# Patient Record
Sex: Female | Born: 1975 | Race: White | Hispanic: No | Marital: Single | Smoking: Current every day smoker
Health system: Southern US, Community
[De-identification: ages and names within clinical notes are randomized; demographics above are authoritative.]

## PROBLEM LIST (undated history)

## (undated) DIAGNOSIS — N200 Calculus of kidney: Secondary | ICD-10-CM

## (undated) DIAGNOSIS — J40 Bronchitis, not specified as acute or chronic: Secondary | ICD-10-CM

## (undated) DIAGNOSIS — F419 Anxiety disorder, unspecified: Secondary | ICD-10-CM

## (undated) DIAGNOSIS — S3992XA Unspecified injury of lower back, initial encounter: Secondary | ICD-10-CM

## (undated) HISTORY — PX: ABDOMINAL HYSTERECTOMY: SHX81

---

## 2012-05-03 ENCOUNTER — Emergency Department (HOSPITAL_BASED_OUTPATIENT_CLINIC_OR_DEPARTMENT_OTHER): Payer: Self-pay

## 2012-05-03 ENCOUNTER — Emergency Department (HOSPITAL_BASED_OUTPATIENT_CLINIC_OR_DEPARTMENT_OTHER)
Admission: EM | Admit: 2012-05-03 | Discharge: 2012-05-03 | Disposition: A | Discharge status: Home / Self Care | Payer: Self-pay | Attending: Emergency Medicine | Admitting: Emergency Medicine

## 2012-05-03 ENCOUNTER — Encounter (HOSPITAL_BASED_OUTPATIENT_CLINIC_OR_DEPARTMENT_OTHER): Payer: Self-pay | Admitting: *Deleted

## 2012-05-03 DIAGNOSIS — Y9289 Other specified places as the place of occurrence of the external cause: Secondary | ICD-10-CM | POA: Insufficient documentation

## 2012-05-03 DIAGNOSIS — W1809XA Striking against other object with subsequent fall, initial encounter: Secondary | ICD-10-CM | POA: Insufficient documentation

## 2012-05-03 DIAGNOSIS — Y9389 Activity, other specified: Secondary | ICD-10-CM | POA: Insufficient documentation

## 2012-05-03 DIAGNOSIS — F17200 Nicotine dependence, unspecified, uncomplicated: Secondary | ICD-10-CM | POA: Insufficient documentation

## 2012-05-03 DIAGNOSIS — W19XXXA Unspecified fall, initial encounter: Secondary | ICD-10-CM

## 2012-05-03 DIAGNOSIS — S20219A Contusion of unspecified front wall of thorax, initial encounter: Secondary | ICD-10-CM | POA: Insufficient documentation

## 2012-05-03 DIAGNOSIS — IMO0002 Reserved for concepts with insufficient information to code with codable children: Secondary | ICD-10-CM | POA: Insufficient documentation

## 2012-05-03 DIAGNOSIS — IMO0001 Reserved for inherently not codable concepts without codable children: Secondary | ICD-10-CM | POA: Insufficient documentation

## 2012-05-03 DIAGNOSIS — M2550 Pain in unspecified joint: Secondary | ICD-10-CM | POA: Insufficient documentation

## 2012-05-03 DIAGNOSIS — F411 Generalized anxiety disorder: Secondary | ICD-10-CM | POA: Insufficient documentation

## 2012-05-03 DIAGNOSIS — Z79899 Other long term (current) drug therapy: Secondary | ICD-10-CM | POA: Insufficient documentation

## 2012-05-03 HISTORY — DX: Anxiety disorder, unspecified: F41.9

## 2012-05-03 HISTORY — DX: Unspecified injury of lower back, initial encounter: S39.92XA

## 2012-05-03 MED ORDER — IBUPROFEN 800 MG PO TABS
800.0000 mg | ORAL_TABLET | Freq: Once | ORAL | Status: AC
  Administered 2012-05-03: 800 mg via ORAL
  Filled 2012-05-03: qty 1

## 2012-05-03 MED ORDER — IBUPROFEN 800 MG PO TABS: 800.0000 mg | ORAL_TABLET | Freq: Three times a day (TID) | ORAL | Status: DC

## 2012-05-03 NOTE — ED Notes (Signed)
Patient fell today in the shower.  States that she fell and her back hit the tub.  C/O upper back pain that radiates to her left shoulder.  Patient denies LOC.  Patient also states that she has history of back fracture when she was a child.  Patient denies any radiation of the pain to her legs.

## 2012-05-03 NOTE — ED Provider Notes (Signed)
History     CSN: [id number]  Arrival date & time 05/03/12  1052   First MD Initiated Contact with Patient 05/03/12 1142      Chief Complaint  Patient presents with  . Fall    (Consider location/radiation/quality/duration/timing/severity/associated sxs/prior treatment) HPI Comments: Patient slipped and fell in the shower today and hit her left side of the tub. She complains of pain to her left ribs and low back. Denies hitting her head or losing consciousness. No headache, neck pain, chest pain or abdominal pain. Pain does not radiate. No bowel bladder incontinence. No focal weakness, numbness or tingling.  The history is provided by the patient.    Past Medical History  Diagnosis Date  . Anxiety   . Back injury     History reviewed. No pertinent past surgical history.  History reviewed. No pertinent family history.  History  Substance Use Topics  . Smoking status: Current Every Day Smoker -- 0.5 packs/day    Types: Cigarettes  . Smokeless tobacco: Not on file  . Alcohol Use: No    OB History    Grav Para Term Preterm Abortions TAB SAB Ect Mult Living                  Review of Systems  Constitutional: Negative for activity change and appetite change.  HENT: Negative for congestion and rhinorrhea.   Respiratory: Negative for cough, chest tightness and shortness of breath.   Cardiovascular: Negative for chest pain.  Gastrointestinal: Negative for nausea, vomiting and abdominal pain.  Genitourinary: Negative for dysuria, hematuria, vaginal bleeding and vaginal discharge.  Musculoskeletal: Positive for myalgias, back pain and arthralgias.  Skin: Negative for rash.  Neurological: Negative for dizziness, weakness and headaches.    Allergies  Review of patient's allergies indicates no known allergies.  Home Medications   Current Outpatient Rx  Name  Route  Sig  Dispense  Refill  . IBUPROFEN 800 MG PO TABS   Oral   Take 1 tablet (800 mg total) by mouth 3  (three) times daily.   21 tablet   0   . LORAZEPAM 1 MG PO TABS   Oral   Take 1 mg by mouth 2 (two) times daily.           BP 139/88  Pulse 73  Temp 97.6 F (36.4 C) (Oral)  Resp 20  Ht 5\' 3"  (1.6 m)  Wt 275 lb (124.739 kg)  BMI 48.71 kg/m2  SpO2 100%  Physical Exam  Constitutional: She is oriented to person, place, and time. She appears well-developed and well-nourished. No distress.  HENT:  Head: Normocephalic and atraumatic.  Mouth/Throat: Oropharynx is clear and moist. No oropharyngeal exudate.  Eyes: Conjunctivae normal and EOM are normal. Pupils are equal, round, and reactive to light.  Neck: Normal range of motion. Neck supple.       No C spine pain, tenderness  Cardiovascular: Normal rate, regular rhythm and normal heart sounds.   No murmur heard. Pulmonary/Chest: Effort normal and breath sounds normal. No respiratory distress.       TTP paraspinal thoracic and lateral ribs.  No ecchymosis or crepitance.  Abdominal: Soft. There is no tenderness. There is no rebound and no guarding.  Musculoskeletal: Normal range of motion. She exhibits no edema and no tenderness.       TTP in lumbar spine.  Neurological: She is alert and oriented to person, place, and time. No cranial nerve deficit. She exhibits normal muscle tone. Coordination normal.  5/5 strength in bilateral lower extremities. Ankle plantar and dorsiflexion intact. Great toe extension intact bilaterally. +2 DP and PT pulses. +2 patellar reflexes bilaterally. Normal gait.   Skin: Skin is warm.    ED Course  Procedures (including critical care time)  Labs Reviewed - No data to display Dg Ribs Unilateral W/chest Left  05/03/2012  *RADIOLOGY REPORT*  Clinical Data: Fall, back pain  LEFT RIBS AND CHEST - 3+ VIEW  Comparison: None.  Findings: Normal cardiac silhouette.  No pulmonary contusion, pleural fluid, or pneumothorax.  No evidence of fracture. Dedicated views of the left ribs demonstrate no fracture.   IMPRESSION: No radiographic evidence of thoracic injury.   Original Report Authenticated By: Genevive Bi, M.D.    Dg Lumbar Spine Complete  05/03/2012  *RADIOLOGY REPORT*  Clinical Data: Fall, back pain  LUMBAR SPINE - COMPLETE 4+ VIEW  Comparison: None  Findings: There is normal alignment of the lumbar vertebral bodies. No loss of vertebral body height or disc height.  No subluxation. Oblique projections demonstrate no pars fracture.  IMPRESSION: No acute findings of the  lumbar spine.   Original Report Authenticated By: Genevive Bi, M.D.      1. Rib contusion   2. Fall       MDM  Mechanical fall in the shower hitting her left side and low back. Did not hit head or lose consciousness. Complains of lumbar spine pain and left-sided rib pain. No shortness of breath.  Vital stable, no hypoxia. Abdomen soft and nontender. No ecchymosis or crepitance to chest wall.  X-rays negative for fracture. We'll treat for rib contusion with anti-inflammatories. Followup with PCP.       Glynn Octave, MD 05/03/12 [phone]

## 2013-02-27 ENCOUNTER — Encounter (HOSPITAL_BASED_OUTPATIENT_CLINIC_OR_DEPARTMENT_OTHER): Payer: Self-pay | Admitting: Emergency Medicine

## 2013-02-27 ENCOUNTER — Emergency Department (HOSPITAL_BASED_OUTPATIENT_CLINIC_OR_DEPARTMENT_OTHER)
Admission: EM | Admit: 2013-02-27 | Discharge: 2013-02-28 | Disposition: A | Discharge status: Home / Self Care | Payer: Self-pay | Attending: Emergency Medicine | Admitting: Emergency Medicine

## 2013-02-27 DIAGNOSIS — Z791 Long term (current) use of non-steroidal anti-inflammatories (NSAID): Secondary | ICD-10-CM | POA: Insufficient documentation

## 2013-02-27 DIAGNOSIS — R05 Cough: Secondary | ICD-10-CM | POA: Insufficient documentation

## 2013-02-27 DIAGNOSIS — Z87828 Personal history of other (healed) physical injury and trauma: Secondary | ICD-10-CM | POA: Insufficient documentation

## 2013-02-27 DIAGNOSIS — R059 Cough, unspecified: Secondary | ICD-10-CM | POA: Insufficient documentation

## 2013-02-27 DIAGNOSIS — F411 Generalized anxiety disorder: Secondary | ICD-10-CM | POA: Insufficient documentation

## 2013-02-27 DIAGNOSIS — Z79899 Other long term (current) drug therapy: Secondary | ICD-10-CM | POA: Insufficient documentation

## 2013-02-27 DIAGNOSIS — F17200 Nicotine dependence, unspecified, uncomplicated: Secondary | ICD-10-CM | POA: Insufficient documentation

## 2013-02-27 DIAGNOSIS — R1110 Vomiting, unspecified: Secondary | ICD-10-CM | POA: Insufficient documentation

## 2013-02-27 DIAGNOSIS — N200 Calculus of kidney: Secondary | ICD-10-CM | POA: Insufficient documentation

## 2013-02-27 HISTORY — DX: Calculus of kidney: N20.0

## 2013-02-27 MED ORDER — KETOROLAC TROMETHAMINE 30 MG/ML IJ SOLN
30.0000 mg | Freq: Once | INTRAMUSCULAR | Status: AC
  Administered 2013-02-28: 30 mg via INTRAVENOUS
  Filled 2013-02-27: qty 1

## 2013-02-27 MED ORDER — ONDANSETRON HCL 4 MG/2ML IJ SOLN
4.0000 mg | Freq: Once | INTRAMUSCULAR | Status: AC
  Administered 2013-02-28: 4 mg via INTRAVENOUS
  Filled 2013-02-27: qty 2

## 2013-02-27 MED ORDER — MORPHINE SULFATE 4 MG/ML IJ SOLN
4.0000 mg | Freq: Once | INTRAMUSCULAR | Status: AC
  Administered 2013-02-28: 4 mg via INTRAVENOUS
  Filled 2013-02-27: qty 1

## 2013-02-27 NOTE — ED Provider Notes (Signed)
CSN: [id number]     Arrival date & time 02/27/13  2325 History  This chart was scribed for Yishai Rehfeld Smitty Cords, MD by Ronal Fear, ED Scribe. This patient was seen in room MH01/MH01 and the patient's care was started at 11:44 PM.      Chief Complaint  Patient presents with  . Flank Pain   Patient is a 37 y.o. female presenting with flank pain and vomiting. The history is provided by the patient. No language interpreter was used.  Flank Pain This is a new problem. The current episode started 6 to 12 hours ago. The problem occurs rarely. The problem has been gradually worsening. Pertinent negatives include no chest pain and no abdominal pain. Nothing aggravates the symptoms. Nothing relieves the symptoms.  Emesis Severity:  Mild Duration:  6 hours Timing:  Rare Quality:  Stomach contents Able to tolerate:  Liquids Progression:  Unchanged Chronicity:  New Recent urination:  Normal Relieved by:  None tried Worsened by:  Nothing tried Ineffective treatments:  None tried Associated symptoms: cough   Associated symptoms: no abdominal pain     pt is complaining of flank pain and emesis. she was seen at [facility] earlier but was not treated. Pain is located in her left groin and radiates to her back. She has had similar pain with kidney stones, she states that the pain feels like she is in labor. She vomited at the hospital, the bags were full.  Pt does not have a urologist currently.  Pt had a hysterectomy.  Past Medical History  Diagnosis Date  . Anxiety   . Back injury    No past surgical history on file. No family history on file. History  Substance Use Topics  . Smoking status: Current Every Day Smoker -- 0.50 packs/day    Types: Cigarettes  . Smokeless tobacco: Not on file  . Alcohol Use: No   OB History   Grav Para Term Preterm Abortions TAB SAB Ect Mult Living                 Review of Systems  Cardiovascular: Negative for chest pain.  Gastrointestinal:  Positive for vomiting. Negative for abdominal pain.  Genitourinary: Positive for dysuria, hematuria and flank pain.  All other systems reviewed and are negative.    Allergies  Review of patient's allergies indicates no known allergies.  Home Medications   Current Outpatient Rx  Name  Route  Sig  Dispense  Refill  . ibuprofen (ADVIL,MOTRIN) 800 MG tablet   Oral   Take 1 tablet (800 mg total) by mouth 3 (three) times daily.   21 tablet   0   . LORazepam (ATIVAN) 1 MG tablet   Oral   Take 1 mg by mouth 2 (two) times daily.          There were no vitals taken for this visit. Physical Exam  Nursing note and vitals reviewed. Constitutional: She appears well-developed and well-nourished.  Awake, alert, nontoxic appearance with baseline speech for patient.  HENT:  Head: Normocephalic and atraumatic.  Mouth/Throat: Oropharynx is clear and moist. No oropharyngeal exudate.  Eyes: EOM are normal. Pupils are equal, round, and reactive to light. Right eye exhibits no discharge. Left eye exhibits no discharge.  Neck: Neck supple.  Cardiovascular: Normal rate, regular rhythm and normal heart sounds.   No murmur heard. Pulmonary/Chest: Effort normal and breath sounds normal. No stridor. No respiratory distress. She has no wheezes. She has no rales. She exhibits  no tenderness.  Abdominal: Soft. Bowel sounds are normal. She exhibits no mass. There is no tenderness. There is no rebound.  Musculoskeletal: She exhibits no tenderness.  Baseline ROM, moves extremities with no obvious new focal weakness.  Lymphadenopathy:    She has no cervical adenopathy.  Neurological: She displays normal reflexes. She exhibits normal muscle tone.  Awake, alert, cooperative and aware of situation; motor strength bilaterally; sensation normal to light touch bilaterally; peripheral visual fields full to confrontation; no facial asymmetry; tongue midline; major cranial nerves appear intact; no pronator drift,  normal finger to nose bilaterally, baseline gait without new ataxia.  Skin: Skin is warm and dry. No rash noted.  Psychiatric: She has a normal mood and affect.    ED Course  Procedures (including critical care time)  COORDINATION OF CARE:  11:50 PM- Pt advised of plan for treatment including pain medication and pt agrees.    Labs Review Labs Reviewed - No data to display Imaging Review No results found.  EKG Interpretation   None       MDM  Kidney stone, feels improved will need pain meds and close follow up with urology.  Return for worsening symptoms I personally performed the services described in this documentation, which was scribed in my presence. The recorded information has been reviewed and is accurate.     Jasmine Awe, MD 02/28/13 0201

## 2013-02-27 NOTE — ED Notes (Signed)
Pt reports flank pain left side with n/v since 1730. Went to [facility] and left due to wait- already eval by EDP Palumbo

## 2013-02-28 ENCOUNTER — Emergency Department (HOSPITAL_BASED_OUTPATIENT_CLINIC_OR_DEPARTMENT_OTHER): Payer: Self-pay

## 2013-02-28 LAB — URINALYSIS, ROUTINE W REFLEX MICROSCOPIC
Bilirubin Urine: NEGATIVE
Leukocytes, UA: NEGATIVE
Nitrite: NEGATIVE
Protein, ur: 300 mg/dL — AB
pH: 5.5 (ref 5.0–8.0)

## 2013-02-28 LAB — URINE MICROSCOPIC-ADD ON

## 2013-02-28 MED ORDER — SODIUM CHLORIDE 0.9 % IV SOLN
Freq: Once | INTRAVENOUS | Status: AC
  Administered 2013-02-28: via INTRAVENOUS

## 2013-02-28 MED ORDER — PROMETHAZINE HCL 12.5 MG RE SUPP: 12.5000 mg | Freq: Four times a day (QID) | RECTAL | Status: DC | PRN

## 2013-02-28 MED ORDER — OXYCODONE-ACETAMINOPHEN 5-325 MG PO TABS: 1.0000 | ORAL_TABLET | Freq: Four times a day (QID) | ORAL | Status: DC | PRN

## 2013-02-28 MED ORDER — OXYCODONE-ACETAMINOPHEN 5-325 MG PO TABS
2.0000 | ORAL_TABLET | Freq: Once | ORAL | Status: AC
  Administered 2013-02-28: 2 via ORAL
  Filled 2013-02-28: qty 2

## 2013-02-28 NOTE — ED Notes (Signed)
rx x 2 given for phenergan and percocet- pt has a ride at bedside

## 2013-03-01 ENCOUNTER — Encounter (HOSPITAL_BASED_OUTPATIENT_CLINIC_OR_DEPARTMENT_OTHER): Payer: Self-pay | Admitting: Emergency Medicine

## 2013-03-01 ENCOUNTER — Emergency Department (HOSPITAL_BASED_OUTPATIENT_CLINIC_OR_DEPARTMENT_OTHER)
Admission: EM | Admit: 2013-03-01 | Discharge: 2013-03-01 | Disposition: A | Discharge status: Home / Self Care | Payer: Self-pay | Attending: Emergency Medicine | Admitting: Emergency Medicine

## 2013-03-01 DIAGNOSIS — N200 Calculus of kidney: Secondary | ICD-10-CM | POA: Insufficient documentation

## 2013-03-01 DIAGNOSIS — N390 Urinary tract infection, site not specified: Secondary | ICD-10-CM | POA: Insufficient documentation

## 2013-03-01 DIAGNOSIS — R109 Unspecified abdominal pain: Secondary | ICD-10-CM

## 2013-03-01 DIAGNOSIS — F17200 Nicotine dependence, unspecified, uncomplicated: Secondary | ICD-10-CM | POA: Insufficient documentation

## 2013-03-01 DIAGNOSIS — A599 Trichomoniasis, unspecified: Secondary | ICD-10-CM | POA: Insufficient documentation

## 2013-03-01 DIAGNOSIS — Z79899 Other long term (current) drug therapy: Secondary | ICD-10-CM | POA: Insufficient documentation

## 2013-03-01 DIAGNOSIS — F411 Generalized anxiety disorder: Secondary | ICD-10-CM | POA: Insufficient documentation

## 2013-03-01 DIAGNOSIS — R110 Nausea: Secondary | ICD-10-CM | POA: Insufficient documentation

## 2013-03-01 DIAGNOSIS — Z87828 Personal history of other (healed) physical injury and trauma: Secondary | ICD-10-CM | POA: Insufficient documentation

## 2013-03-01 LAB — URINE MICROSCOPIC-ADD ON

## 2013-03-01 LAB — CBC WITH DIFFERENTIAL/PLATELET
Basophils Absolute: 0 10*3/uL (ref 0.0–0.1)
Lymphocytes Relative: 21 % (ref 12–46)
Lymphs Abs: 2.3 10*3/uL (ref 0.7–4.0)
Neutro Abs: 7.5 10*3/uL (ref 1.7–7.7)
Platelets: 281 10*3/uL (ref 150–400)
RBC: 4.38 MIL/uL (ref 3.87–5.11)
RDW: 13.2 % (ref 11.5–15.5)
WBC: 10.7 10*3/uL — ABNORMAL HIGH (ref 4.0–10.5)

## 2013-03-01 LAB — BASIC METABOLIC PANEL
CO2: 27 mEq/L (ref 19–32)
Chloride: 99 mEq/L (ref 96–112)
Potassium: 3.6 mEq/L (ref 3.5–5.1)
Sodium: 134 mEq/L — ABNORMAL LOW (ref 135–145)

## 2013-03-01 LAB — URINALYSIS, ROUTINE W REFLEX MICROSCOPIC
Glucose, UA: NEGATIVE mg/dL
Protein, ur: NEGATIVE mg/dL
Specific Gravity, Urine: 1.009 (ref 1.005–1.030)
Urobilinogen, UA: 0.2 mg/dL (ref 0.0–1.0)

## 2013-03-01 MED ORDER — SODIUM CHLORIDE 0.9 % IV SOLN
Freq: Once | INTRAVENOUS | Status: AC
  Administered 2013-03-01: 14:00:00 via INTRAVENOUS

## 2013-03-01 MED ORDER — KETOROLAC TROMETHAMINE 30 MG/ML IJ SOLN
30.0000 mg | Freq: Once | INTRAMUSCULAR | Status: AC
  Administered 2013-03-01: 30 mg via INTRAVENOUS
  Filled 2013-03-01: qty 1

## 2013-03-01 MED ORDER — CEPHALEXIN 500 MG PO CAPS: 500.0000 mg | ORAL_CAPSULE | Freq: Four times a day (QID) | ORAL | Status: DC

## 2013-03-01 MED ORDER — METRONIDAZOLE 500 MG PO TABS
2000.0000 mg | ORAL_TABLET | Freq: Once | ORAL | Status: AC
  Administered 2013-03-01: 2000 mg via ORAL
  Filled 2013-03-01: qty 4

## 2013-03-01 NOTE — ED Provider Notes (Signed)
CSN: [id number]     Arrival date & time 03/01/13  1243 History   First MD Initiated Contact with Patient 03/01/13 1317     Chief Complaint  Patient presents with  . Flank Pain   (Consider location/radiation/quality/duration/timing/severity/associated sxs/prior Treatment) Patient is a 37 y.o. female presenting with flank pain. The history is provided by the patient and medical records. No language interpreter was used.  Flank Pain This is a recurrent problem. The current episode started in the past 7 days. The problem occurs constantly. The problem has been waxing and waning. Associated symptoms include nausea and urinary symptoms. She has tried oral narcotics for the symptoms. The treatment provided mild relief.   Patient presents today for reevaluation as pain medication at home has provided limited/minimal relief. Past Medical History  Diagnosis Date  . Anxiety   . Back injury   . Kidney stone    Past Surgical History  Procedure Laterality Date  . Abdominal hysterectomy    . Cesarean section     No family history on file. History  Substance Use Topics  . Smoking status: Current Every Day Smoker -- 0.50 packs/day    Types: Cigarettes  . Smokeless tobacco: Never Used  . Alcohol Use: Yes   OB History   Grav Para Term Preterm Abortions TAB SAB Ect Mult Living                 Review of Systems  Gastrointestinal: Positive for nausea.  Genitourinary: Positive for flank pain.       Vaginal itching, no discharge  All other systems reviewed and are negative.    Allergies  Review of patient's allergies indicates no known allergies.  Home Medications   Current Outpatient Rx  Name  Route  Sig  Dispense  Refill  . cephALEXin (KEFLEX) 500 MG capsule   Oral   Take 1 capsule (500 mg total) by mouth 4 (four) times daily.   20 capsule   0   . ibuprofen (ADVIL,MOTRIN) 800 MG tablet   Oral   Take 1 tablet (800 mg total) by mouth 3 (three) times daily.   21 tablet   0   .  LORazepam (ATIVAN) 1 MG tablet   Oral   Take 1 mg by mouth 2 (two) times daily.         Marland Kitchen oxyCODONE-acetaminophen (PERCOCET) 5-325 MG per tablet   Oral   Take 1 tablet by mouth every 6 (six) hours as needed for pain.   17 tablet   0   . promethazine (PHENERGAN) 12.5 MG suppository   Rectal   Place 1 suppository (12.5 mg total) rectally every 6 (six) hours as needed for nausea.   12 each   0    BP 151/94  Pulse 76  Temp(Src) 98 F (36.7 C) (Oral)  Resp 17  Ht 5\' 3"  (1.6 m)  Wt 300 lb (136.079 kg)  BMI 53.16 kg/m2  SpO2 100% Physical Exam  Nursing note and vitals reviewed. Constitutional: She is oriented to person, place, and time. She appears well-developed and well-nourished.  HENT:  Head: Normocephalic and atraumatic.  Eyes: Pupils are equal, round, and reactive to light.  Neck: Normal range of motion.  Cardiovascular: Normal rate and regular rhythm.   Pulmonary/Chest: Effort normal and breath sounds normal.  Abdominal: Soft. There is CVA tenderness.  Musculoskeletal: She exhibits no edema and no tenderness.       Back:  Neurological: She is alert and oriented to person, place,  and time.  Skin: Skin is warm and dry.  Psychiatric: She has a normal mood and affect. Her behavior is normal. Judgment and thought content normal.    ED Course  Procedures (including critical care time) Labs Review Labs Reviewed  URINALYSIS, ROUTINE W REFLEX MICROSCOPIC - Abnormal; Notable for the following:    APPearance CLOUDY (*)    Hgb urine dipstick LARGE (*)    Leukocytes, UA MODERATE (*)    All other components within normal limits  URINE MICROSCOPIC-ADD ON - Abnormal; Notable for the following:    Squamous Epithelial / LPF MANY (*)    Bacteria, UA MANY (*)    All other components within normal limits  CBC WITH DIFFERENTIAL - Abnormal; Notable for the following:    WBC 10.7 (*)    All other components within normal limits  BASIC METABOLIC PANEL - Abnormal; Notable for the  following:    Sodium 134 (*)    GFR calc non Af Amer 65 (*)    GFR calc Af Amer 75 (*)    All other components within normal limits  URINE CULTURE   Imaging Review Ct Abdomen Pelvis Wo Contrast  02/28/2013   CLINICAL DATA:  Severe left flank pain for the past 7 hr. Gross hematuria. History of nephrolithiasis. Previous hysterectomy.  EXAM: CT ABDOMEN AND PELVIS WITHOUT CONTRAST  TECHNIQUE: Multidetector CT imaging of the abdomen and pelvis was performed following the standard protocol without intravenous contrast.  COMPARISON:  None.  FINDINGS: 2 mm calculus at the left ureteropelvic junction with mild dilatation of the left renal collecting system. The ureter below the calculus is normal in caliber with no additional calculi seen. Normal appearing right kidney, right ureter and urinary bladder.  4.7 cm left ovarian cyst. Normal appearing right ovary. Surgically absent uterus.  Small number of sigmoid colon diverticula without evidence of diverticulitis. Normal appearing appendix. Unremarkable non and contrasted appearance of the liver, spleen, pancreas, gallbladder and left adrenal gland. 1.8 x 1.5 cm rounded, low density right adrenal mass on image number 34, measuring -11 Hounsfield units in density. Clear lung bases. Minimal thoracolumbar spine degenerative change. Bilateral L5 pars interarticularis defects without spondylolisthesis. No enlarged lymph nodes.  IMPRESSION: 1. 2 mm left ureteropelvic junction calculus causing mild left hydronephrosis. 2. 4.7 cm left ovarian cyst. This does not require followup. This recommendation follows ACR consensus guidelines: White Paper of the ACR Incidental Findings Committee II on Adnexal Findings. J Am Coll Radiol 2013:10:675-681. 3. Bilateral L5 spondylolysis without spondylolisthesis. 4. 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   Electronically Signed   By: Gordan Payment M.D.   On: 02/28/2013 01:02    EKG Interpretation   None     Radiology results from prior  visit reviewed.  Today's labs indicated improving hematuria.  Likely contaminated urine specimen with numerous epithelials, is leukocyte positive with many bacteria and trichomonas present.  Minimally elevated WBC, normal creatinine.  After medications and fluids in ED, patient's pain is currently totally resolved.  Treated for trichomas, antibiotic prescription for UTI.  Follow-up with urology again recommended.  MDM   1. Kidney stone   2. Left flank pain   3. Trichomonas   4. UTI (lower urinary tract infection)        Jimmye Norman, NP 03/01/13 1523

## 2013-03-01 NOTE — ED Notes (Signed)
Pt. Reports she has been seen here at [facility] for kidney stone on Sat.   Pt. Reports L flank pain today and is not getting better with pain meds.

## 2013-03-01 NOTE — ED Provider Notes (Signed)
Medical screening examination/treatment/procedure(s) were performed by non-physician practitioner and as supervising physician I was immediately available for consultation/collaboration.  Ethelda Chick, MD 03/01/13 [phone]

## 2013-03-02 LAB — URINE CULTURE: Culture: NO GROWTH

## 2013-03-05 ENCOUNTER — Encounter (HOSPITAL_BASED_OUTPATIENT_CLINIC_OR_DEPARTMENT_OTHER): Payer: Self-pay | Admitting: Emergency Medicine

## 2013-03-05 ENCOUNTER — Emergency Department (HOSPITAL_BASED_OUTPATIENT_CLINIC_OR_DEPARTMENT_OTHER)
Admission: EM | Admit: 2013-03-05 | Discharge: 2013-03-05 | Disposition: A | Discharge status: Home / Self Care | Payer: Self-pay | Attending: Emergency Medicine | Admitting: Emergency Medicine

## 2013-03-05 DIAGNOSIS — Z79899 Other long term (current) drug therapy: Secondary | ICD-10-CM | POA: Insufficient documentation

## 2013-03-05 DIAGNOSIS — E669 Obesity, unspecified: Secondary | ICD-10-CM | POA: Insufficient documentation

## 2013-03-05 DIAGNOSIS — Z87828 Personal history of other (healed) physical injury and trauma: Secondary | ICD-10-CM | POA: Insufficient documentation

## 2013-03-05 DIAGNOSIS — Z3202 Encounter for pregnancy test, result negative: Secondary | ICD-10-CM | POA: Insufficient documentation

## 2013-03-05 DIAGNOSIS — R197 Diarrhea, unspecified: Secondary | ICD-10-CM | POA: Insufficient documentation

## 2013-03-05 DIAGNOSIS — R110 Nausea: Secondary | ICD-10-CM | POA: Insufficient documentation

## 2013-03-05 DIAGNOSIS — Z8659 Personal history of other mental and behavioral disorders: Secondary | ICD-10-CM | POA: Insufficient documentation

## 2013-03-05 DIAGNOSIS — N201 Calculus of ureter: Secondary | ICD-10-CM | POA: Insufficient documentation

## 2013-03-05 DIAGNOSIS — F17200 Nicotine dependence, unspecified, uncomplicated: Secondary | ICD-10-CM | POA: Insufficient documentation

## 2013-03-05 LAB — URINALYSIS, ROUTINE W REFLEX MICROSCOPIC
Ketones, ur: NEGATIVE mg/dL
Leukocytes, UA: NEGATIVE
Nitrite: NEGATIVE
Specific Gravity, Urine: 1.02 (ref 1.005–1.030)
pH: 5.5 (ref 5.0–8.0)

## 2013-03-05 LAB — BASIC METABOLIC PANEL
BUN: 12 mg/dL (ref 6–23)
Chloride: 100 mEq/L (ref 96–112)
GFR calc Af Amer: >90 mL/min (ref >90)
Potassium: 3.9 mEq/L (ref 3.5–5.1)

## 2013-03-05 LAB — CBC WITH DIFFERENTIAL/PLATELET
Basophils Absolute: 0 10*3/uL (ref 0.0–0.1)
Basophils Relative: 0 % (ref 0–1)
Hemoglobin: 13.3 g/dL (ref 12.0–15.0)
MCHC: 33 g/dL (ref 30.0–36.0)
Monocytes Relative: 7 % (ref 3–12)
Neutro Abs: 9.4 10*3/uL — ABNORMAL HIGH (ref 1.7–7.7)
Neutrophils Relative %: 77 % (ref 43–77)
WBC: 12.2 10*3/uL — ABNORMAL HIGH (ref 4.0–10.5)

## 2013-03-05 LAB — PREGNANCY, URINE: Preg Test, Ur: NEGATIVE

## 2013-03-05 MED ORDER — OXYCODONE-ACETAMINOPHEN 5-325 MG PO TABS: 1.0000 | ORAL_TABLET | Freq: Four times a day (QID) | ORAL | Status: DC | PRN

## 2013-03-05 MED ORDER — ONDANSETRON HCL 4 MG/2ML IJ SOLN
4.0000 mg | Freq: Once | INTRAMUSCULAR | Status: AC
  Administered 2013-03-05: 4 mg via INTRAVENOUS
  Filled 2013-03-05: qty 2

## 2013-03-05 MED ORDER — HYDROMORPHONE HCL PF 1 MG/ML IJ SOLN
1.0000 mg | Freq: Once | INTRAMUSCULAR | Status: AC
  Administered 2013-03-05: 1 mg via INTRAVENOUS
  Filled 2013-03-05: qty 1

## 2013-03-05 MED ORDER — KETOROLAC TROMETHAMINE 30 MG/ML IJ SOLN
30.0000 mg | Freq: Once | INTRAMUSCULAR | Status: AC
  Administered 2013-03-05: 30 mg via INTRAVENOUS
  Filled 2013-03-05: qty 1

## 2013-03-05 NOTE — ED Notes (Signed)
Left flank pain radiating to LLQ since 0300 this am.  She was seen here Saturday and diagnosed with kidney stone.

## 2013-03-05 NOTE — ED Provider Notes (Signed)
CSN: [id number]     Arrival date & time 03/05/13  4098 History   First MD Initiated Contact with Patient 03/05/13 [phone]     Chief Complaint  Patient presents with  . Flank Pain  . Diarrhea   (Consider location/radiation/quality/duration/timing/severity/associated sxs/prior Treatment) Patient is a 37 y.o. female presenting with flank pain and diarrhea. The history is provided by the patient.  Flank Pain This is a recurrent problem. Pertinent negatives include no chest pain, no abdominal pain, no headaches and no shortness of breath.  Diarrhea Associated symptoms: no abdominal pain, no headaches and no vomiting    patient's pain in her left flank. She's been seen twice this week for kidney stones. She states the pain is improved t but hen it returned. It is in her lower left flank. She's had a little diarrhea. No fevers. She's had nausea without vomiting. She states she's had a little bit of burning with urination. She's out of her pain medicines. She's had previous kidney stones.   Past Medical History  Diagnosis Date  . Anxiety   . Back injury   . Kidney stone    Past Surgical History  Procedure Laterality Date  . Abdominal hysterectomy    . Cesarean section     No family history on file. History  Substance Use Topics  . Smoking status: Current Every Day Smoker -- 0.50 packs/day    Types: Cigarettes  . Smokeless tobacco: Never Used  . Alcohol Use: Yes     Comment: occa   OB History   Grav Para Term Preterm Abortions TAB SAB Ect Mult Living                 Review of Systems  Constitutional: Negative for activity change and appetite change.  Eyes: Negative for pain.  Respiratory: Negative for chest tightness and shortness of breath.   Cardiovascular: Negative for chest pain and leg swelling.  Gastrointestinal: Positive for nausea and diarrhea. Negative for vomiting and abdominal pain.  Genitourinary: Positive for dysuria and flank pain.  Musculoskeletal: Negative for  back pain and neck stiffness.  Skin: Negative for rash.  Neurological: Negative for weakness, numbness and headaches.  Psychiatric/Behavioral: Negative for behavioral problems.    Allergies  Review of patient's allergies indicates no known allergies.  Home Medications   Current Outpatient Rx  Name  Route  Sig  Dispense  Refill  . cephALEXin (KEFLEX) 500 MG capsule   Oral   Take 1 capsule (500 mg total) by mouth 4 (four) times daily.   20 capsule   0   . ibuprofen (ADVIL,MOTRIN) 800 MG tablet   Oral   Take 1 tablet (800 mg total) by mouth 3 (three) times daily.   21 tablet   0   . oxyCODONE-acetaminophen (PERCOCET) 5-325 MG per tablet   Oral   Take 1 tablet by mouth every 6 (six) hours as needed for pain.   17 tablet   0   . oxyCODONE-acetaminophen (PERCOCET/ROXICET) 5-325 MG per tablet   Oral   Take 1-2 tablets by mouth every 6 (six) hours as needed for pain.   20 tablet   0   . promethazine (PHENERGAN) 12.5 MG suppository   Rectal   Place 1 suppository (12.5 mg total) rectally every 6 (six) hours as needed for nausea.   12 each   0    BP 140/83  Pulse 72  Temp(Src) 98.2 F (36.8 C) (Oral)  Resp 18  SpO2 66% Physical Exam  Nursing note and vitals reviewed. Constitutional: She is oriented to person, place, and time. She appears well-developed and well-nourished.  Patient is obese  HENT:  Head: Normocephalic and atraumatic.  Eyes: EOM are normal. Pupils are equal, round, and reactive to light.  Neck: Normal range of motion. Neck supple.  Cardiovascular: Normal rate, regular rhythm and normal heart sounds.   No murmur heard. Pulmonary/Chest: Effort normal and breath sounds normal. No respiratory distress. She has no wheezes. She has no rales.  Abdominal: Soft. Bowel sounds are normal. She exhibits no distension. There is no tenderness. There is no rebound and no guarding.  Mild left lower abdominal tenderness. No rebound or guarding.  Genitourinary:  Some  CVA tenderness on left.  Musculoskeletal: Normal range of motion.  Neurological: She is alert and oriented to person, place, and time. No cranial nerve deficit.  Skin: Skin is warm and dry. No rash noted. No erythema.  Psychiatric: She has a normal mood and affect. Her speech is normal.    ED Course  Procedures (including critical care time) Labs Review Labs Reviewed  URINALYSIS, ROUTINE W REFLEX MICROSCOPIC - Abnormal; Notable for the following:    APPearance CLOUDY (*)    All other components within normal limits  CBC WITH DIFFERENTIAL - Abnormal; Notable for the following:    WBC 12.2 (*)    Neutro Abs 9.4 (*)    All other components within normal limits  BASIC METABOLIC PANEL - Abnormal; Notable for the following:    Glucose, Bld 105 (*)    GFR calc non Af Amer 82 (*)    All other components within normal limits  PREGNANCY, URINE   Imaging Review No results found.  EKG Interpretation   None       MDM   1. Ureteral stone    Patient with flank pain. Recent ureteral stone. Urine does not show infection for pain is improved. Will discharge home with pain medicine and urology followup.   Juliet Rude. Rubin Payor, MD 03/06/13 [phone]

## 2013-05-10 ENCOUNTER — Emergency Department (HOSPITAL_BASED_OUTPATIENT_CLINIC_OR_DEPARTMENT_OTHER)
Admission: EM | Admit: 2013-05-10 | Discharge: 2013-05-10 | Disposition: A | Discharge status: Home / Self Care | Payer: Self-pay | Attending: Emergency Medicine | Admitting: Emergency Medicine

## 2013-05-10 ENCOUNTER — Emergency Department (HOSPITAL_BASED_OUTPATIENT_CLINIC_OR_DEPARTMENT_OTHER): Payer: Self-pay

## 2013-05-10 ENCOUNTER — Encounter (HOSPITAL_BASED_OUTPATIENT_CLINIC_OR_DEPARTMENT_OTHER): Payer: Self-pay | Admitting: Emergency Medicine

## 2013-05-10 DIAGNOSIS — Z8659 Personal history of other mental and behavioral disorders: Secondary | ICD-10-CM | POA: Insufficient documentation

## 2013-05-10 DIAGNOSIS — Z87828 Personal history of other (healed) physical injury and trauma: Secondary | ICD-10-CM | POA: Insufficient documentation

## 2013-05-10 DIAGNOSIS — E669 Obesity, unspecified: Secondary | ICD-10-CM | POA: Insufficient documentation

## 2013-05-10 DIAGNOSIS — Z87442 Personal history of urinary calculi: Secondary | ICD-10-CM | POA: Insufficient documentation

## 2013-05-10 DIAGNOSIS — R079 Chest pain, unspecified: Secondary | ICD-10-CM | POA: Insufficient documentation

## 2013-05-10 DIAGNOSIS — Z792 Long term (current) use of antibiotics: Secondary | ICD-10-CM | POA: Insufficient documentation

## 2013-05-10 DIAGNOSIS — Z791 Long term (current) use of non-steroidal anti-inflammatories (NSAID): Secondary | ICD-10-CM | POA: Insufficient documentation

## 2013-05-10 DIAGNOSIS — H9209 Otalgia, unspecified ear: Secondary | ICD-10-CM | POA: Insufficient documentation

## 2013-05-10 DIAGNOSIS — J069 Acute upper respiratory infection, unspecified: Secondary | ICD-10-CM | POA: Insufficient documentation

## 2013-05-10 DIAGNOSIS — F17200 Nicotine dependence, unspecified, uncomplicated: Secondary | ICD-10-CM | POA: Insufficient documentation

## 2013-05-10 DIAGNOSIS — R05 Cough: Secondary | ICD-10-CM

## 2013-05-10 HISTORY — DX: Bronchitis, not specified as acute or chronic: J40

## 2013-05-10 MED ORDER — HYDROCOD POLST-CHLORPHEN POLST 10-8 MG/5ML PO LQCR: 5.0000 mL | Freq: Two times a day (BID) | ORAL | Status: DC | PRN

## 2013-05-10 MED ORDER — ALBUTEROL SULFATE HFA 108 (90 BASE) MCG/ACT IN AERS
4.0000 | INHALATION_SPRAY | Freq: Once | RESPIRATORY_TRACT | Status: AC
  Administered 2013-05-10: 4 via RESPIRATORY_TRACT
  Filled 2013-05-10: qty 6.7

## 2013-05-10 NOTE — ED Provider Notes (Signed)
CSN: [id number]     Arrival date & time 05/10/13  1011 History   First MD Initiated Contact with Patient 05/10/13 1012     Chief Complaint  Patient presents with  . Cough  . Otalgia   (Consider location/radiation/quality/duration/timing/severity/associated sxs/prior Treatment) Patient is a 37 y.o. female presenting with cough and ear pain.  Cough Cough characteristics:  Non-productive Severity:  Moderate Onset quality:  Gradual Duration:  2 days Timing:  Constant Progression:  Worsening Chronicity:  New Smoker: yes   Relieved by:  Nothing Worsened by:  Deep breathing Ineffective treatments: ibuprofen. Associated symptoms: chest pain (when coughing), ear fullness, ear pain, shortness of breath and sinus congestion   Associated symptoms: no fever   Otalgia Associated symptoms: congestion and cough   Associated symptoms: no abdominal pain, no diarrhea, no fever and no vomiting     Past Medical History  Diagnosis Date  . Anxiety   . Back injury   . Kidney stone   . Bronchitis    Past Surgical History  Procedure Laterality Date  . Abdominal hysterectomy    . Cesarean section     No family history on file. History  Substance Use Topics  . Smoking status: Current Every Day Smoker -- 0.50 packs/day    Types: Cigarettes  . Smokeless tobacco: Never Used  . Alcohol Use: Yes     Comment: occa   OB History   Grav Para Term Preterm Abortions TAB SAB Ect Mult Living                 Review of Systems  Constitutional: Negative for fever.  HENT: Positive for congestion and ear pain.   Respiratory: Positive for cough and shortness of breath.   Cardiovascular: Positive for chest pain (when coughing).  Gastrointestinal: Negative for nausea, vomiting, abdominal pain and diarrhea.  All other systems reviewed and are negative.    Allergies  Review of patient's allergies indicates no known allergies.  Home Medications   Current Outpatient Rx  Name  Route  Sig  Dispense   Refill  . cephALEXin (KEFLEX) 500 MG capsule   Oral   Take 1 capsule (500 mg total) by mouth 4 (four) times daily.   20 capsule   0   . ibuprofen (ADVIL,MOTRIN) 800 MG tablet   Oral   Take 1 tablet (800 mg total) by mouth 3 (three) times daily.   21 tablet   0   . oxyCODONE-acetaminophen (PERCOCET) 5-325 MG per tablet   Oral   Take 1 tablet by mouth every 6 (six) hours as needed for pain.   17 tablet   0   . oxyCODONE-acetaminophen (PERCOCET/ROXICET) 5-325 MG per tablet   Oral   Take 1-2 tablets by mouth every 6 (six) hours as needed for pain.   20 tablet   0   . promethazine (PHENERGAN) 12.5 MG suppository   Rectal   Place 1 suppository (12.5 mg total) rectally every 6 (six) hours as needed for nausea.   12 each   0    BP 136/78  Pulse 109  Temp(Src) 99.4 F (37.4 C) (Oral)  Resp 20  Ht 5\' 3"  (1.6 m)  Wt 300 lb (136.079 kg)  BMI 53.16 kg/m2  SpO2 98% Physical Exam  Nursing note and vitals reviewed. Constitutional: She is oriented to person, place, and time. She appears well-developed and well-nourished. No distress.  obese  HENT:  Head: Normocephalic and atraumatic.  Right Ear: Tympanic membrane and ear canal normal.  Left Ear: Tympanic membrane and ear canal normal.  Mouth/Throat: Oropharynx is clear and moist. No oropharyngeal exudate.  Eyes: Conjunctivae are normal. Pupils are equal, round, and reactive to light. No scleral icterus.  Neck: Neck supple.  Cardiovascular: Normal rate, regular rhythm, normal heart sounds and intact distal pulses.   No murmur heard. Pulmonary/Chest: Effort normal and breath sounds normal. No stridor. No respiratory distress. She has no rales.  Abdominal: Soft. Bowel sounds are normal. She exhibits no distension. There is no tenderness.  Musculoskeletal: Normal range of motion.  Neurological: She is alert and oriented to person, place, and time.  Skin: Skin is warm and dry. No rash noted.  Psychiatric: She has a normal mood  and affect. Her behavior is normal.    ED Course  Procedures (including critical care time) Labs Review Labs Reviewed - No data to display Imaging Review Dg Chest 2 View  05/10/2013   CLINICAL DATA:  Cough and congestion  EXAM: CHEST  2 VIEW  COMPARISON:  May 03, 2012  FINDINGS: Lungs are clear. Heart size and pulmonary vascularity are normal. No adenopathy. No bone lesions.  IMPRESSION: No abnormality noted.   Electronically Signed   By: Bretta Bang M.D.   On: 05/10/2013 10:48  All radiology studies independently viewed by me.     EKG Interpretation   None       MDM   1. Cough   2. Acute URI    URI symptoms with cough.  Likely viral.  Complains of dry cough with some shortness of breath.  Will trial albuterol and check CXR.  Well appearing, not distressed.    Minimal improvement after albuterol.  Remains well appearing.  CXR without signs of pneumonia. Will give prescription for tussionex.    Candyce Churn, MD 05/10/13 [phone]

## 2013-05-10 NOTE — ED Notes (Signed)
Pt c/o cough, congestion, ear pain x 2 days. Pt reports h/o bronchitis.

## 2013-10-24 ENCOUNTER — Emergency Department (HOSPITAL_BASED_OUTPATIENT_CLINIC_OR_DEPARTMENT_OTHER)
Admission: EM | Admit: 2013-10-24 | Discharge: 2013-10-24 | Disposition: A | Discharge status: Home / Self Care | Payer: BC Managed Care – PPO | Attending: Emergency Medicine | Admitting: Emergency Medicine

## 2013-10-24 ENCOUNTER — Emergency Department (HOSPITAL_BASED_OUTPATIENT_CLINIC_OR_DEPARTMENT_OTHER): Payer: BC Managed Care – PPO

## 2013-10-24 ENCOUNTER — Encounter (HOSPITAL_BASED_OUTPATIENT_CLINIC_OR_DEPARTMENT_OTHER): Payer: Self-pay | Admitting: Emergency Medicine

## 2013-10-24 DIAGNOSIS — IMO0002 Reserved for concepts with insufficient information to code with codable children: Secondary | ICD-10-CM | POA: Insufficient documentation

## 2013-10-24 DIAGNOSIS — F17200 Nicotine dependence, unspecified, uncomplicated: Secondary | ICD-10-CM | POA: Insufficient documentation

## 2013-10-24 DIAGNOSIS — Z87442 Personal history of urinary calculi: Secondary | ICD-10-CM | POA: Insufficient documentation

## 2013-10-24 DIAGNOSIS — X500XXA Overexertion from strenuous movement or load, initial encounter: Secondary | ICD-10-CM | POA: Insufficient documentation

## 2013-10-24 DIAGNOSIS — Y9301 Activity, walking, marching and hiking: Secondary | ICD-10-CM | POA: Insufficient documentation

## 2013-10-24 DIAGNOSIS — Z8709 Personal history of other diseases of the respiratory system: Secondary | ICD-10-CM | POA: Insufficient documentation

## 2013-10-24 DIAGNOSIS — Z8659 Personal history of other mental and behavioral disorders: Secondary | ICD-10-CM | POA: Insufficient documentation

## 2013-10-24 DIAGNOSIS — Y929 Unspecified place or not applicable: Secondary | ICD-10-CM | POA: Insufficient documentation

## 2013-10-24 MED ORDER — HYDROCODONE-ACETAMINOPHEN 5-325 MG PO TABS: 1.0000 | ORAL_TABLET | Freq: Four times a day (QID) | ORAL | Status: DC | PRN

## 2013-10-24 MED ORDER — HYDROCODONE-ACETAMINOPHEN 5-325 MG PO TABS
2.0000 | ORAL_TABLET | Freq: Once | ORAL | Status: AC
  Administered 2013-10-24: 2 via ORAL
  Filled 2013-10-24: qty 2

## 2013-10-24 MED ORDER — IBUPROFEN 400 MG PO TABS
600.0000 mg | ORAL_TABLET | Freq: Once | ORAL | Status: AC
  Administered 2013-10-24: 11:00:00 600 mg via ORAL
  Filled 2013-10-24 (×2): qty 1

## 2013-10-24 MED ORDER — IBUPROFEN 600 MG PO TABS: 600.0000 mg | ORAL_TABLET | Freq: Four times a day (QID) | ORAL | Status: DC | PRN

## 2013-10-24 NOTE — ED Notes (Signed)
MD at bedside. 

## 2013-10-24 NOTE — ED Provider Notes (Signed)
CSN: [id number]     Arrival date & time 10/24/13  1009 History   First MD Initiated Contact with Patient 10/24/13 1022     Chief Complaint  Patient presents with  . Knee Pain     (Consider location/radiation/quality/duration/timing/severity/associated sxs/prior Treatment) HPI Comments: Pt states that she had injury to the right knee 3 days ago. States that whilst walking, she heard a pop, and bent her knee. Since then, she has increased swelling to the ankle and to the knee. Pt has severe pain with ambulation - and in fact doesn't walk at all. Pt did have some ligament/tendon injury to the same knee 5 years ago, no surgery required.   Patient is a 38 y.o. female presenting with knee pain. The history is provided by the patient.  Knee Pain   Past Medical History  Diagnosis Date  . Anxiety   . Back injury   . Kidney stone   . Bronchitis    Past Surgical History  Procedure Laterality Date  . Abdominal hysterectomy    . Cesarean section     No family history on file. History  Substance Use Topics  . Smoking status: Current Every Day Smoker -- 0.50 packs/day    Types: Cigarettes  . Smokeless tobacco: Never Used  . Alcohol Use: Yes     Comment: occa   OB History   Grav Para Term Preterm Abortions TAB SAB Ect Mult Living                 Review of Systems  Constitutional: Positive for activity change.  Cardiovascular: Negative for chest pain.  Musculoskeletal: Positive for arthralgias, gait problem, joint swelling and myalgias.  Skin: Negative for rash and wound.  Hematological: Does not bruise/bleed easily.      Allergies  Review of patient's allergies indicates no known allergies.  Home Medications   Prior to Admission medications   Medication Sig Start Date End Date Taking? Authorizing Provider  HYDROcodone-acetaminophen (NORCO/VICODIN) 5-325 MG per tablet Take 1 tablet by mouth every 6 (six) hours as needed. 10/24/13   Derwood Kaplan, MD  ibuprofen (ADVIL,MOTRIN)  600 MG tablet Take 1 tablet (600 mg total) by mouth every 6 (six) hours as needed. 10/24/13   Sonnet Rizor Rhunette Croft, MD   BP 151/98  Pulse 88  Temp(Src) 98.1 F (36.7 C) (Oral)  Resp 14  Ht 5\' 3"  (1.6 m)  Wt 300 lb (136.079 kg)  BMI 53.16 kg/m2  SpO2 98% Physical Exam  Nursing note and vitals reviewed. Constitutional: She appears well-developed.  Morbidly obese  Eyes: Conjunctivae are normal.  Neck: Neck supple.  Cardiovascular: Normal rate and intact distal pulses.   Pulmonary/Chest: Effort normal.  Musculoskeletal:  Pt's exam is limited due to her morbid obesity.  Pt's knee is edematous. There is no erythema, or warmth to touch. There is tenderness to palpation of the suprapatellar region and on the medial aspect of the knee. Pt has neg ant/post drawers. + tenderness with valgus stress.     ED Course  Procedures (including critical care time) Labs Review Labs Reviewed - No data to display  Imaging Review Dg Knee Complete 4 Views Right  10/24/2013   CLINICAL DATA:  Acute onset right knee pain and swelling.  EXAM: RIGHT KNEE - COMPLETE 4+ VIEW  COMPARISON:  None.  FINDINGS: There is no evidence of fracture, or dislocation. Small knee joint effusion noted. There is no evidence of arthropathy or other focal bone abnormality.  IMPRESSION: Small knee joint effusion.  No osseous abnormality identified.   Electronically Signed   By: Myles RosenthalJohn  Stahl M.D.   On: 10/24/2013 11:11     EKG Interpretation None      MDM   Final diagnoses:  Knee sprain and strain    Pt with knee injury whilst ambulating. She is morbidly obese, exam is limited - but she is able to flex/extend the knee and the knee has no laxity, so low suspicion for complete tear. Xrays are neg. Suspect knee ligament injury.  Best to have patient see Sports medicine for PT/OT vs. US/MRI vs. Ortho referral. Knee immobilezed, partial weight bearing, RICE tx until then.    Derwood KaplanAnkit Freemon Binford, MD 10/24/13 1153

## 2013-10-24 NOTE — Discharge Instructions (Signed)
See Dr. Pearletha Forge as requested. Partial weight bearing, as tolerated, until then. Use RICE therapy as below:   Knee Sprain A knee sprain is a tear in one of the strong, fibrous tissues that connect the bones (ligaments) in your knee. The severity of the sprain depends on how much of the ligament is torn. The tear can be either partial or complete. CAUSES  Often, sprains are a result of a fall or injury. The force of the impact causes the fibers of your ligament to stretch too much. This excess tension causes the fibers of your ligament to tear. SIGNS AND SYMPTOMS  You may have some loss of motion in your knee. Other symptoms include:  Bruising.  Pain in the knee area.  Tenderness of the knee to the touch.  Swelling. DIAGNOSIS  To diagnose a knee sprain, your health care provider will physically examine your knee. Your health care provider may also suggest an X-ray exam of your knee to make sure no bones are broken. TREATMENT  If your ligament is only partially torn, treatment usually involves keeping the knee in a fixed position (immobilization) or bracing your knee for activities that require movement for several weeks. To do this, your health care provider will apply a bandage, cast, or splint to keep your knee from moving and to support your knee during movement until it heals. For a partially torn ligament, the healing process usually takes 4 6 weeks. If your ligament is completely torn, depending on which ligament it is, you may need surgery to reconnect the ligament to the bone or reconstruct it. After surgery, a cast or splint may be applied and will need to stay on your knee for 4 6 weeks while your ligament heals. HOME CARE INSTRUCTIONS  Keep your injured knee elevated to decrease swelling.  To ease pain and swelling, apply ice to the injured area:  Put ice in a plastic bag.  Place a towel between your skin and the bag.  Leave the ice on for 20 minutes, 2 3 times a  day.  Only take medicine for pain as directed by your health care provider.  Do not leave your knee unprotected until pain and stiffness go away (usually 4 6 weeks).  If you have a cast or splint, do not allow it to get wet. If you have been instructed not to remove it, cover it with a plastic bag when you shower or bathe. Do not swim.  Your health care provider may suggest exercises for you to do during your recovery to prevent or limit permanent weakness and stiffness. SEEK IMMEDIATE MEDICAL CARE IF:  Your cast or splint becomes damaged.  Your pain becomes worse.  You have significant pain, swelling, or numbness below the cast or splint. MAKE SURE YOU:  Understand these instructions.  Will watch your condition.  Will get help right away if you are not doing well or get worse. Document Released: 05/06/2005 Document Revised: 02/24/2013 Document Reviewed: 12/16/2012 [facility] Patient Information 2014 [place], [place]. RICE: Routine Care for Injuries The routine care of many injuries includes Rest, Ice, Compression, and Elevation (RICE). HOME CARE INSTRUCTIONS  Rest is needed to allow your body to heal. Routine activities can usually be resumed when comfortable. Injured tendons and bones can take up to 6 weeks to heal. Tendons are the cord-like structures that attach muscle to bone.  Ice following an injury helps keep the swelling down and reduces pain.  Put ice in a plastic bag.  Place  a towel between your skin and the bag.  Leave the ice on for 15-20 minutes, 03-04 times a day. Do this while awake, for the first 24 to 48 hours. After that, continue as directed by your caregiver.  Compression helps keep swelling down. It also gives support and helps with discomfort. If an elastic bandage has been applied, it should be removed and reapplied every 3 to 4 hours. It should not be applied tightly, but firmly enough to keep swelling down. Watch fingers or toes for swelling, bluish  discoloration, coldness, numbness, or excessive pain. If any of these problems occur, remove the bandage and reapply loosely. Contact your caregiver if these problems continue.  Elevation helps reduce swelling and decreases pain. With extremities, such as the arms, hands, legs, and feet, the injured area should be placed near or above the level of the heart, if possible. SEEK IMMEDIATE MEDICAL CARE IF:  You have persistent pain and swelling.  You develop redness, numbness, or unexpected weakness.  Your symptoms are getting worse rather than improving after several days. These symptoms may indicate that further evaluation or further X-rays are needed. Sometimes, X-rays may not show a small broken bone (fracture) until 1 week or 10 days later. Make a follow-up appointment with your caregiver. Ask when your X-ray results will be ready. Make sure you get your X-ray results. Document Released: 08/18/2000 Document Revised: 07/29/2011 Document Reviewed: 10/05/2010 [facility] Patient Information 2014 [place].

## 2013-10-24 NOTE — ED Notes (Signed)
Patient here with right knee pain x 3 days. Reports that she was walking at work and heard a pop, now pain and swelling to same with radiation up and down leg. Pain worse with any ambulation

## 2013-11-11 ENCOUNTER — Encounter (HOSPITAL_BASED_OUTPATIENT_CLINIC_OR_DEPARTMENT_OTHER): Payer: Self-pay | Admitting: Emergency Medicine

## 2013-11-11 DIAGNOSIS — Z8659 Personal history of other mental and behavioral disorders: Secondary | ICD-10-CM | POA: Insufficient documentation

## 2013-11-11 DIAGNOSIS — Z87828 Personal history of other (healed) physical injury and trauma: Secondary | ICD-10-CM | POA: Insufficient documentation

## 2013-11-11 DIAGNOSIS — Z8709 Personal history of other diseases of the respiratory system: Secondary | ICD-10-CM | POA: Insufficient documentation

## 2013-11-11 DIAGNOSIS — F17200 Nicotine dependence, unspecified, uncomplicated: Secondary | ICD-10-CM | POA: Insufficient documentation

## 2013-11-11 DIAGNOSIS — Z87442 Personal history of urinary calculi: Secondary | ICD-10-CM | POA: Insufficient documentation

## 2013-11-11 DIAGNOSIS — M25569 Pain in unspecified knee: Secondary | ICD-10-CM | POA: Insufficient documentation

## 2013-11-11 NOTE — ED Notes (Signed)
Pt seen previously for knee pain, has appt July 21 for ortho appt, but unable to tolerate pain at this time

## 2013-11-12 ENCOUNTER — Emergency Department (HOSPITAL_BASED_OUTPATIENT_CLINIC_OR_DEPARTMENT_OTHER)
Admission: EM | Admit: 2013-11-12 | Discharge: 2013-11-12 | Disposition: A | Discharge status: Home / Self Care | Payer: BC Managed Care – PPO | Attending: Emergency Medicine | Admitting: Emergency Medicine

## 2013-11-12 DIAGNOSIS — M25561 Pain in right knee: Secondary | ICD-10-CM

## 2013-11-12 MED ORDER — HYDROCODONE-ACETAMINOPHEN 5-325 MG PO TABS: 1.0000 | ORAL_TABLET | Freq: Four times a day (QID) | ORAL | Status: DC | PRN

## 2013-11-12 NOTE — Discharge Instructions (Signed)

## 2013-11-12 NOTE — ED Provider Notes (Signed)
CSN: [id number]     Arrival date & time 11/11/13  2306 History   First MD Initiated Contact with Patient 11/12/13 0017     Chief Complaint  Patient presents with  . Knee Pain     (Consider location/radiation/quality/duration/timing/severity/associated sxs/prior Treatment) HPI Comments: Patient is a 38 year old morbidly obese female who presents with complaints of right knee pain. She was seen 3 weeks ago for the same and had x-rays performed which were unremarkable. She has had continued pain and is out of her hydrocodone which she was prescribed. She is having no relief with ibuprofen at home. She has a followup appointment scheduled with orthopedics in July but states that she cannot take the pain.  Patient is a 38 y.o. female presenting with knee pain. The history is provided by the patient.  Knee Pain Location:  Knee Injury: yes   Knee location:  R knee Pain details:    Quality:  Sharp   Radiates to:  Does not radiate   Severity:  Severe   Onset quality:  Sudden   Duration:  3 weeks   Timing:  Constant   Progression:  Unchanged   Past Medical History  Diagnosis Date  . Anxiety   . Back injury   . Kidney stone   . Bronchitis    Past Surgical History  Procedure Laterality Date  . Abdominal hysterectomy    . Cesarean section     History reviewed. No pertinent family history. History  Substance Use Topics  . Smoking status: Current Every Day Smoker -- 1.00 packs/day    Types: Cigarettes  . Smokeless tobacco: Never Used  . Alcohol Use: Yes     Comment: occa   OB History   Grav Para Term Preterm Abortions TAB SAB Ect Mult Living                 Review of Systems  All other systems reviewed and are negative.     Allergies  Review of patient's allergies indicates no known allergies.  Home Medications   Prior to Admission medications   Medication Sig Start Date End Date Taking? Authorizing Aviraj Kentner  ibuprofen (ADVIL,MOTRIN) 600 MG tablet Take 1 tablet (600  mg total) by mouth every 6 (six) hours as needed. 10/24/13  Yes Derwood KaplanAnkit Nanavati, MD  HYDROcodone-acetaminophen (NORCO/VICODIN) 5-325 MG per tablet Take 1 tablet by mouth every 6 (six) hours as needed. 10/24/13   Ankit Rhunette CroftNanavati, MD   BP 147/90  Pulse 96  Temp(Src) 98.2 F (36.8 C) (Oral)  Resp 20  Ht 5\' 3"  (1.6 m)  Wt 300 lb (136.079 kg)  BMI 53.16 kg/m2  SpO2 96% Physical Exam  Nursing note and vitals reviewed. Constitutional: She is oriented to person, place, and time. She appears well-developed and well-nourished. No distress.  HENT:  Head: Normocephalic and atraumatic.  Neck: Normal range of motion. Neck supple.  Musculoskeletal:  The right knee appears grossly normal. There is no redness and no palpable effusion, however exam is limited due to her obesity. There is pain with range of motion but no instability with varus or valgus stress. Anterior and posterior drawer tests are negative.  Neurological: She is alert and oriented to person, place, and time.  Skin: Skin is warm and dry. She is not diaphoretic.    ED Course  Procedures (including critical care time) Labs Review Labs Reviewed - No data to display  Imaging Review No results found.   EKG Interpretation None      MDM  Final diagnoses:  None    I agreed to refill her prescription for hydrocodone. She is to followup with orthopedics. She can call to have the appointment moved up. I strongly doubt a septic joint or other emergent cause.    Geoffery Lyonsouglas Delo, MD 11/12/13 [phone]

## 2013-11-12 NOTE — ED Notes (Signed)
C/o rt knee pain x 1 month  Worse today  Pt states knee is swollen,no swelling noted

## 2014-03-06 ENCOUNTER — Emergency Department (HOSPITAL_BASED_OUTPATIENT_CLINIC_OR_DEPARTMENT_OTHER): Payer: BC Managed Care – PPO

## 2014-03-06 ENCOUNTER — Emergency Department (HOSPITAL_BASED_OUTPATIENT_CLINIC_OR_DEPARTMENT_OTHER)
Admission: EM | Admit: 2014-03-06 | Discharge: 2014-03-06 | Disposition: A | Discharge status: Home / Self Care | Payer: BC Managed Care – PPO | Attending: Emergency Medicine | Admitting: Emergency Medicine

## 2014-03-06 ENCOUNTER — Encounter (HOSPITAL_BASED_OUTPATIENT_CLINIC_OR_DEPARTMENT_OTHER): Payer: Self-pay | Admitting: Emergency Medicine

## 2014-03-06 DIAGNOSIS — M545 Low back pain: Secondary | ICD-10-CM | POA: Diagnosis present

## 2014-03-06 DIAGNOSIS — R197 Diarrhea, unspecified: Secondary | ICD-10-CM | POA: Insufficient documentation

## 2014-03-06 DIAGNOSIS — Z792 Long term (current) use of antibiotics: Secondary | ICD-10-CM | POA: Diagnosis not present

## 2014-03-06 DIAGNOSIS — Z791 Long term (current) use of non-steroidal anti-inflammatories (NSAID): Secondary | ICD-10-CM | POA: Insufficient documentation

## 2014-03-06 DIAGNOSIS — F419 Anxiety disorder, unspecified: Secondary | ICD-10-CM | POA: Insufficient documentation

## 2014-03-06 DIAGNOSIS — Z87442 Personal history of urinary calculi: Secondary | ICD-10-CM | POA: Diagnosis not present

## 2014-03-06 DIAGNOSIS — A599 Trichomoniasis, unspecified: Secondary | ICD-10-CM | POA: Diagnosis not present

## 2014-03-06 DIAGNOSIS — N8320 Unspecified ovarian cysts: Secondary | ICD-10-CM | POA: Insufficient documentation

## 2014-03-06 DIAGNOSIS — Z720 Tobacco use: Secondary | ICD-10-CM | POA: Diagnosis not present

## 2014-03-06 DIAGNOSIS — Z90710 Acquired absence of both cervix and uterus: Secondary | ICD-10-CM | POA: Diagnosis not present

## 2014-03-06 DIAGNOSIS — Z79899 Other long term (current) drug therapy: Secondary | ICD-10-CM | POA: Diagnosis not present

## 2014-03-06 DIAGNOSIS — Z3202 Encounter for pregnancy test, result negative: Secondary | ICD-10-CM | POA: Diagnosis not present

## 2014-03-06 DIAGNOSIS — Z8709 Personal history of other diseases of the respiratory system: Secondary | ICD-10-CM | POA: Diagnosis not present

## 2014-03-06 DIAGNOSIS — N83202 Unspecified ovarian cyst, left side: Secondary | ICD-10-CM

## 2014-03-06 DIAGNOSIS — Z87828 Personal history of other (healed) physical injury and trauma: Secondary | ICD-10-CM | POA: Insufficient documentation

## 2014-03-06 DIAGNOSIS — R109 Unspecified abdominal pain: Secondary | ICD-10-CM

## 2014-03-06 LAB — CBC
HCT: 41.6 % (ref 36.0–46.0)
Hemoglobin: 13.5 g/dL (ref 12.0–15.0)
MCH: 28.3 pg (ref 26.0–34.0)
MCHC: 32.5 g/dL (ref 30.0–36.0)
MCV: 87.2 fL (ref 78.0–100.0)
PLATELETS: 327 10*3/uL (ref 150–400)
RBC: 4.77 MIL/uL (ref 3.87–5.11)
RDW: 14.5 % (ref 11.5–15.5)
WBC: 11.5 10*3/uL — AB (ref 4.0–10.5)

## 2014-03-06 LAB — URINALYSIS, ROUTINE W REFLEX MICROSCOPIC
Bilirubin Urine: NEGATIVE
Glucose, UA: NEGATIVE mg/dL
HGB URINE DIPSTICK: NEGATIVE
Ketones, ur: NEGATIVE mg/dL
Nitrite: NEGATIVE
PH: 6 (ref 5.0–8.0)
Protein, ur: NEGATIVE mg/dL
SPECIFIC GRAVITY, URINE: 1.008 (ref 1.005–1.030)
Urobilinogen, UA: 0.2 mg/dL (ref 0.0–1.0)

## 2014-03-06 LAB — COMPREHENSIVE METABOLIC PANEL
ALBUMIN: 3.6 g/dL (ref 3.5–5.2)
ALT: 12 U/L (ref 0–35)
AST: 14 U/L (ref 0–37)
Alkaline Phosphatase: 63 U/L (ref 39–117)
Anion gap: 14 (ref 5–15)
BUN: 10 mg/dL (ref 6–23)
CALCIUM: 9.6 mg/dL (ref 8.4–10.5)
CO2: 24 mEq/L (ref 19–32)
CREATININE: 0.8 mg/dL (ref 0.50–1.10)
Chloride: 99 mEq/L (ref 96–112)
GFR calc Af Amer: >90 mL/min (ref >90)
GFR calc non Af Amer: >90 mL/min (ref >90)
Glucose, Bld: 102 mg/dL — ABNORMAL HIGH (ref 70–99)
Potassium: 4 mEq/L (ref 3.7–5.3)
SODIUM: 137 meq/L (ref 137–147)
Total Bilirubin: 0.4 mg/dL (ref 0.3–1.2)
Total Protein: 8 g/dL (ref 6.0–8.3)

## 2014-03-06 LAB — URINE MICROSCOPIC-ADD ON

## 2014-03-06 LAB — OCCULT BLOOD X 1 CARD TO LAB, STOOL: FECAL OCCULT BLD: POSITIVE — AB

## 2014-03-06 LAB — PREGNANCY, URINE: PREG TEST UR: NEGATIVE

## 2014-03-06 MED ORDER — SODIUM CHLORIDE 0.9 % IV BOLUS (SEPSIS)
1000.0000 mL | Freq: Once | INTRAVENOUS | Status: AC
  Administered 2014-03-06: 1000 mL via INTRAVENOUS

## 2014-03-06 MED ORDER — METRONIDAZOLE 500 MG PO TABS: 500.0000 mg | ORAL_TABLET | Freq: Two times a day (BID) | ORAL | Status: DC

## 2014-03-06 MED ORDER — IOHEXOL 300 MG/ML  SOLN
50.0000 mL | Freq: Once | INTRAMUSCULAR | Status: AC | PRN
  Administered 2014-03-06: 50 mL via ORAL

## 2014-03-06 MED ORDER — IOHEXOL 300 MG/ML  SOLN
100.0000 mL | Freq: Once | INTRAMUSCULAR | Status: AC | PRN
  Administered 2014-03-06: 100 mL via INTRAVENOUS

## 2014-03-06 MED ORDER — DIPHENOXYLATE-ATROPINE 2.5-0.025 MG PO TABS: 1.0000 | ORAL_TABLET | Freq: Four times a day (QID) | ORAL | Status: DC | PRN

## 2014-03-06 MED ORDER — DICYCLOMINE HCL 20 MG PO TABS: 20.0000 mg | ORAL_TABLET | Freq: Two times a day (BID) | ORAL | Status: DC

## 2014-03-06 MED ORDER — DICYCLOMINE HCL 10 MG PO CAPS
10.0000 mg | ORAL_CAPSULE | Freq: Once | ORAL | Status: AC
  Administered 2014-03-06: 10 mg via ORAL
  Filled 2014-03-06: qty 1

## 2014-03-06 NOTE — Discharge Instructions (Signed)
Take flagyl as directed for 7 days for trichomoniasis. This is a sexually transmitted disease, and you are obligated to inform your partner. Take lomotil as directed for diarrhea. Bentyl is for abdominal cramping. Follow up with your primary care doctor for recheck, and your gynecologist.  Bloody Diarrhea Bloody diarrhea can be caused by many different conditions. Most of the time bloody diarrhea is the result of food poisoning or minor infections. Bloody diarrhea usually improves over 2 to 3 days of rest and fluid replacement. Other conditions that can cause bloody diarrhea include:  Internal bleeding.  Infection.  Diseases of the bowel and colon. Internal bleeding from an ulcer or bowel disease can be severe and requires hospital care or even surgery. DIAGNOSIS  To find out what is wrong your caregiver may check your:  Stool.  Blood.  Results from a test that looks inside the body (endoscopy). TREATMENT   Get plenty of rest.  Drink enough water and fluids to keep your urine clear or pale yellow.  Do not smoke.  Solid foods and dairy products should be avoided until your illness improves.  As you improve, slowly return to a regular diet with easily-digested foods first. Examples are:  Bananas.  Rice.  Toast.  Crackers. You should only need these for about 2 days before adding more normal foods to your diet.  Avoid spicy or fatty foods as well as caffeine and alcohol for several days.  Medicine to control cramping and diarrhea can relieve symptoms but may prolong some cases of bloody diarrhea. Antibiotics can speed recovery from diarrhea due to some bacterial infections. Call your caregiver if diarrhea does not get better in 3 days. SEEK MEDICAL CARE IF:   You do not improve after 3 days.  Your diarrhea improves but your stool appears black. SEEK IMMEDIATE MEDICAL CARE IF:   You become extremely weak or faint.  You become very sweaty.  You have increased pain or  bleeding.  You develop repeated vomiting.  You vomit and you see blood or the vomit looks black in color.  You have a fever. Document Released: 05/06/2005 Document Revised: 07/29/2011 Document Reviewed: 04/07/2009 [facility] Patient Information 2015 [place]. This information is not intended to replace advice given to you by your health care provider. Make sure you discuss any questions you have with your health care provider.  Ovarian Cyst An ovarian cyst is a fluid-filled sac that forms on an ovary. The ovaries are small organs that produce eggs in women. Various types of cysts can form on the ovaries. Most are not cancerous. Many do not cause problems, and they often go away on their own. Some may cause symptoms and require treatment. Common types of ovarian cysts include:  Functional cysts--These cysts may occur every month during the menstrual cycle. This is normal. The cysts usually go away with the next menstrual cycle if the woman does not get pregnant. Usually, there are no symptoms with a functional cyst.  Endometrioma cysts--These cysts form from the tissue that lines the uterus. They are also called "chocolate cysts" because they become filled with blood that turns brown. This type of cyst can cause pain in the lower abdomen during intercourse and with your menstrual period.  Cystadenoma cysts--This type develops from the cells on the outside of the ovary. These cysts can get very big and cause lower abdomen pain and pain with intercourse. This type of cyst can twist on itself, cut off its blood supply, and cause severe pain.  It can also easily rupture and cause a lot of pain.  Dermoid cysts--This type of cyst is sometimes found in both ovaries. These cysts may contain different kinds of body tissue, such as skin, teeth, hair, or cartilage. They usually do not cause symptoms unless they get very big.  Theca lutein cysts--These cysts occur when too much of a certain hormone  (human chorionic gonadotropin) is produced and overstimulates the ovaries to produce an egg. This is most common after procedures used to assist with the conception of a baby (in vitro fertilization). CAUSES   Fertility drugs can cause a condition in which multiple large cysts are formed on the ovaries. This is called ovarian hyperstimulation syndrome.  A condition called polycystic ovary syndrome can cause hormonal imbalances that can lead to nonfunctional ovarian cysts. SIGNS AND SYMPTOMS  Many ovarian cysts do not cause symptoms. If symptoms are present, they may include:  Pelvic pain or pressure.  Pain in the lower abdomen.  Pain during sexual intercourse.  Increasing girth (swelling) of the abdomen.  Abnormal menstrual periods.  Increasing pain with menstrual periods.  Stopping having menstrual periods without being pregnant. DIAGNOSIS  These cysts are commonly found during a routine or annual pelvic exam. Tests may be ordered to find out more about the cyst. These tests may include:  Ultrasound.  X-ray of the pelvis.  CT scan.  MRI.  Blood tests. TREATMENT  Many ovarian cysts go away on their own without treatment. Your health care provider may want to check your cyst regularly for 2-3 months to see if it changes. For women in menopause, it is particularly important to monitor a cyst closely because of the higher rate of ovarian cancer in menopausal women. When treatment is needed, it may include any of the following:  A procedure to drain the cyst (aspiration). This may be done using a long needle and ultrasound. It can also be done through a laparoscopic procedure. This involves using a thin, lighted tube with a tiny camera on the end (laparoscope) inserted through a small incision.  Surgery to remove the whole cyst. This may be done using laparoscopic surgery or an open surgery involving a larger incision in the lower abdomen.  Hormone treatment or birth control  pills. These methods are sometimes used to help dissolve a cyst. HOME CARE INSTRUCTIONS   Only take over-the-counter or prescription medicines as directed by your health care provider.  Follow up with your health care provider as directed.  Get regular pelvic exams and Pap tests. SEEK MEDICAL CARE IF:   Your periods are late, irregular, or painful, or they stop.  Your pelvic pain or abdominal pain does not go away.  Your abdomen becomes larger or swollen.  You have pressure on your bladder or trouble emptying your bladder completely.  You have pain during sexual intercourse.  You have feelings of fullness, pressure, or discomfort in your stomach.  You lose weight for no apparent reason.  You feel generally ill.  You become constipated.  You lose your appetite.  You develop acne.  You have an increase in body and facial hair.  You are gaining weight, without changing your exercise and eating habits.  You think you are pregnant. SEEK IMMEDIATE MEDICAL CARE IF:   You have increasing abdominal pain.  You feel sick to your stomach (nauseous), and you throw up (vomit).  You develop a fever that comes on suddenly.  You have abdominal pain during a bowel movement.  Your menstrual  periods become heavier than usual. MAKE SURE YOU:  Understand these instructions.  Will watch your condition.  Will get help right away if you are not doing well or get worse. Document Released: 05/06/2005 Document Revised: 05/11/2013 Document Reviewed: 01/11/2013 [facility] Patient Information 2015 [place], [place]. This information is not intended to replace advice given to you by your health care provider. Make sure you discuss any questions you have with your health care provider.  Trichomoniasis Trichomoniasis is an infection caused by an organism called Trichomonas. The infection can affect both women and men. In women, the outer female genitalia and the vagina are affected. In men, the  penis is mainly affected, but the prostate and other reproductive organs can also be involved. Trichomoniasis is a sexually transmitted infection (STI) and is most often passed to another person through sexual contact.  RISK FACTORS  Having unprotected sexual intercourse.  Having sexual intercourse with an infected partner. SIGNS AND SYMPTOMS  Symptoms of trichomoniasis in women include:  Abnormal gray-green frothy vaginal discharge.  Itching and irritation of the vagina.  Itching and irritation of the area outside the vagina. Symptoms of trichomoniasis in men include:   Penile discharge with or without pain.  Pain during urination. This results from inflammation of the urethra. DIAGNOSIS  Trichomoniasis may be found during a Pap test or physical exam. Your health care provider may use one of the following methods to help diagnose this infection:  Examining vaginal discharge under a microscope. For men, urethral discharge would be examined.  Testing the pH of the vagina with a test tape.  Using a vaginal swab test that checks for the Trichomonas organism. A test is available that provides results within a few minutes.  Doing a culture test for the organism. This is not usually needed. TREATMENT   You may be given medicine to fight the infection. Women should inform their health care provider if they could be or are pregnant. Some medicines used to treat the infection should not be taken during pregnancy.  Your health care provider may recommend over-the-counter medicines or creams to decrease itching or irritation.  Your sexual partner will need to be treated if infected. HOME CARE INSTRUCTIONS   Take medicines only as directed by your health care provider.  Take over-the-counter medicine for itching or irritation as directed by your health care provider.  Do not have sexual intercourse while you have the infection.  Women should not douche or wear tampons while they have  the infection.  Discuss your infection with your partner. Your partner may have gotten the infection from you, or you may have gotten it from your partner.  Have your sex partner get examined and treated if necessary.  Practice safe, informed, and protected sex.  See your health care provider for other STI testing. SEEK MEDICAL CARE IF:   You still have symptoms after you finish your medicine.  You develop abdominal pain.  You have pain when you urinate.  You have bleeding after sexual intercourse.  You develop a rash.  Your medicine makes you sick or makes you throw up (vomit). MAKE SURE YOU:  Understand these instructions.  Will watch your condition.  Will get help right away if you are not doing well or get worse. Document Released: 10/30/2000 Document Revised: 09/20/2013 Document Reviewed: 02/15/2013 [facility] Patient Information 2015 [place], [place]. This information is not intended to replace advice given to you by your health care provider. Make sure you discuss any questions you have with  your health care provider. ° °

## 2014-03-06 NOTE — ED Notes (Signed)
Patient states that she has had lower back pain for 3 days. States that she has been having frequent bowel movements and cramping.

## 2014-03-06 NOTE — ED Provider Notes (Signed)
Medical screening examination/treatment/procedure(s) were performed by non-physician practitioner and as supervising physician I was immediately available for consultation/collaboration.   EKG Interpretation None       Harshini Trent, MD 03/06/14 2359 

## 2014-03-06 NOTE — ED Notes (Signed)
I notified nurse Christy of patient BP of 102/50.

## 2014-03-06 NOTE — ED Provider Notes (Signed)
CSN: [id number]     Arrival date & time 03/06/14  1848 History   First MD Initiated Contact with Patient 03/06/14 1926     Chief Complaint  Patient presents with  . Back Pain     (Consider location/radiation/quality/duration/timing/severity/associated sxs/prior Treatment) HPI Comments: This is a 38 year old morbidly obese female with a past medical history of anxiety, bipolar, chronic low back pain and kidney stones who presents to the emergency department complaining of low back pain times one week. No injury or trauma, she reports sharp pains to her lower back initially, and it is now a dull ache across her lower back. No aggravating or alleviating factors. Yesterday she started to develop lower abdominal cramping and frequent, watery bowel movements. Today, she started to notice blood in her stool. States she's had multiple episodes of diarrhea, and after she uses the bathroom she has severe cramping for about 10 minutes. Denies fever, chills, nausea or vomiting. No recent travel. No sick contacts. Denies any urinary symptoms.  Patient is a 38 y.o. female presenting with back pain. The history is provided by the patient.  Back Pain   Past Medical History  Diagnosis Date  . Anxiety   . Back injury   . Kidney stone   . Bronchitis    Past Surgical History  Procedure Laterality Date  . Abdominal hysterectomy    . Cesarean section     No family history on file. History  Substance Use Topics  . Smoking status: Current Every Day Smoker -- 1.00 packs/day    Types: Cigarettes  . Smokeless tobacco: Never Used  . Alcohol Use: Yes     Comment: occa   OB History   Grav Para Term Preterm Abortions TAB SAB Ect Mult Living                 Review of Systems   10 Systems reviewed and are negative for acute change except as noted in the HPI.    Allergies  Review of patient's allergies indicates no known allergies.  Home Medications   Prior to Admission medications   Medication  Sig Start Date End Date Taking? Authorizing Provider  busPIRone (BUSPAR) 15 MG tablet Take 15 mg by mouth 3 (three) times daily.   Yes Historical Provider, MD  mirtazapine (REMERON) 15 MG tablet Take 15 mg by mouth at bedtime.   Yes Historical Provider, MD  dicyclomine (BENTYL) 20 MG tablet Take 1 tablet (20 mg total) by mouth 2 (two) times daily. 03/06/14   Malaquias Lenker M Ariannah Arenson, PA-C  diphenoxylate-atropine (LOMOTIL) 2.5-0.025 MG per tablet Take 1 tablet by mouth 4 (four) times daily as needed for diarrhea or loose stools. 03/06/14   Kathrynn Speed, PA-C  HYDROcodone-acetaminophen (NORCO/VICODIN) 5-325 MG per tablet Take 1 tablet by mouth every 6 (six) hours as needed. 10/24/13   Derwood Kaplan, MD  HYDROcodone-acetaminophen (NORCO/VICODIN) 5-325 MG per tablet Take 1-2 tablets by mouth every 6 (six) hours as needed for moderate pain. 11/12/13   Geoffery Lyons, MD  ibuprofen (ADVIL,MOTRIN) 600 MG tablet Take 1 tablet (600 mg total) by mouth every 6 (six) hours as needed. 10/24/13   Derwood Kaplan, MD  metroNIDAZOLE (FLAGYL) 500 MG tablet Take 1 tablet (500 mg total) by mouth 2 (two) times daily. One po bid x 7 days 03/06/14   Natalio Salois M Preet Perrier, PA-C   BP 114/61  Pulse 79  Temp(Src) 97.8 F (36.6 C) (Oral)  Resp 20  SpO2 100% Physical Exam  Nursing note and  vitals reviewed. Constitutional: She is oriented to person, place, and time. She appears well-developed and well-nourished. No distress.  Morbidly obese.  HENT:  Head: Normocephalic and atraumatic.  Mouth/Throat: Oropharynx is clear and moist.  Eyes: Conjunctivae are normal.  Neck: Normal range of motion. Neck supple. No spinous process tenderness and no muscular tenderness present.  Cardiovascular: Normal rate, regular rhythm and normal heart sounds.   Pulmonary/Chest: Effort normal and breath sounds normal. No respiratory distress.  Abdominal: Soft. Bowel sounds are normal. She exhibits no distension. There is tenderness.  Tenderness across lower abdomen,  worse on the left. No rigidity, guarding or rebound. No peritoneal signs.  Musculoskeletal: She exhibits no edema.  Tenderness across lower back, right side worse than left, no specific point tenderness. No CVA tenderness.  Neurological: She is alert and oriented to person, place, and time. She has normal strength.  Strength lower extremities 5/5 and equal bilateral. Sensation intact. Gait not assessed on initial exam.  Skin: Skin is warm and dry. No rash noted. She is not diaphoretic.  Psychiatric: She has a normal mood and affect. Her behavior is normal.    ED Course  Procedures (including critical care time) Labs Review Labs Reviewed  CBC - Abnormal; Notable for the following:    WBC 11.5 (*)    All other components within normal limits  COMPREHENSIVE METABOLIC PANEL - Abnormal; Notable for the following:    Glucose, Bld 102 (*)    All other components within normal limits  URINALYSIS, ROUTINE W REFLEX MICROSCOPIC - Abnormal; Notable for the following:    Leukocytes, UA SMALL (*)    All other components within normal limits  OCCULT BLOOD X 1 CARD TO LAB, STOOL - Abnormal; Notable for the following:    Fecal Occult Bld POSITIVE (*)    All other components within normal limits  URINE MICROSCOPIC-ADD ON - Abnormal; Notable for the following:    Squamous Epithelial / LPF FEW (*)    Bacteria, UA FEW (*)    All other components within normal limits  GC/CHLAMYDIA PROBE AMP  PREGNANCY, URINE  GI PATHOGEN PANEL BY PCR, STOOL    Imaging Review Ct Abdomen Pelvis W Contrast  03/06/2014   CLINICAL DATA:  Low back pain for 3 days. Frequent bowel movements with cramping. History of prior kidney stones.  EXAM: CT ABDOMEN AND PELVIS WITH CONTRAST  TECHNIQUE: Multidetector CT imaging of the abdomen and pelvis was performed using the standard protocol following bolus administration of intravenous contrast.  CONTRAST:  50mL OMNIPAQUE IOHEXOL 300 MG/ML SOLN, 100mL OMNIPAQUE IOHEXOL 300 MG/ML  SOLN  COMPARISON:  02/28/2013  FINDINGS: Slight fibrosis in the lung bases.  Spleen is borderline enlarged. 17 mm right adrenal gland nodule is stable since previous study. The liver, gallbladder, pancreas, left adrenal gland, bilateral kidneys, abdominal aorta, inferior vena cava, and retroperitoneal lymph nodes are unremarkable. Stomach and small bowel are normal for degree of distention. Colon is mostly decompressed. No free air or free fluid in the abdomen. Abdominal wall musculature appears intact.  Pelvis: Surgical absence of the uterus. Enlarged left ovary measuring 4 x 5.4 cm. This is mildly enlarged since previous study. Ultrasound correlation is recommended. Right ovary is unremarkable. Appendix is normal. No free or loculated pelvic fluid collections. No pelvic lymphadenopathy. Mild degenerative changes in the lumbar spine. No destructive bone lesions. Bilateral spondylolysis of L5. Possible slight spondylolisthesis at L5-S1.  IMPRESSION: Enlarging left ovary. Ultrasound correlation recommended. No ureteral stone or obstruction. Stable right adrenal  gland nodule. 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   Electronically Signed   By: Burman NievesWilliam  Stevens M.D.   On: 03/06/2014 21:39     EKG Interpretation None      MDM   Final diagnoses:  Bloody diarrhea  Abdominal cramping  Trichomoniasis  Left ovarian cyst   Patient is nontoxic appearing and in no apparent distress. Watery, bloody diarrhea noted on arrival. Afebrile, vital signs stable. Abdomen is soft with no peritoneal signs. Normal bowel sounds. Regarding back pain, no signs or symptoms of central cord compression or cauda equina. Ambulates without difficulty. Abdominal cramping symptoms most consistent with a gastroenteritis/colitis. Labs showing mild leukocytosis of 11.5. CMP normal. Urinalysis positive for trichomonas. I discussed this with patient, states she had this in the past and must not have been cleared. She states she is not concerned of  any other sexually transmitted diseases and would not like treatment for gonorrhea or chlamydia. Urine cultures pending. Pelvic exam deferred. GI pathogen panel pending. Fecal occult blood positive, however hemodynamically stable. CT of the abdomen obtained as exam is limited by patient's body habitus, and with the bloody diarrhea and back pain. Results showing enlarging left ovary, no other acute finding. I do not feel left ovarian cyst is the cause of patient's abdominal cramping and diarrhea. I discussed this finding the patient and she can followup with her gynecologist. Regarding bloody stool, I advised her followup with her PCP for reevaluation. Discussed symptomatic treatment including Bentyl and Lomotil. Flagyl for Trichomonas. Stable for discharge. Return precautions given. Patient states understanding of treatment care plan and is agreeable.  Kathrynn SpeedRobyn M Chyan Carnero, PA-C 03/06/14 2221

## 2014-03-08 LAB — GI PATHOGEN PANEL BY PCR, STOOL
C DIFFICILE TOXIN A/B: NEGATIVE
CAMPYLOBACTER BY PCR: NEGATIVE
CRYPTOSPORIDIUM BY PCR: NEGATIVE
E COLI (ETEC) LT/ST: NEGATIVE
E COLI 0157 BY PCR: NEGATIVE
E coli (STEC): NEGATIVE
G LAMBLIA BY PCR: NEGATIVE
Norovirus GI/GII: NEGATIVE
Rotavirus A by PCR: NEGATIVE
SHIGELLA BY PCR: NEGATIVE
Salmonella by PCR: NEGATIVE

## 2014-03-08 LAB — GC/CHLAMYDIA PROBE AMP
CT Probe RNA: NEGATIVE
GC Probe RNA: NEGATIVE

## 2014-03-24 ENCOUNTER — Emergency Department (HOSPITAL_BASED_OUTPATIENT_CLINIC_OR_DEPARTMENT_OTHER): Payer: BC Managed Care – PPO

## 2014-03-24 ENCOUNTER — Encounter (HOSPITAL_BASED_OUTPATIENT_CLINIC_OR_DEPARTMENT_OTHER): Payer: Self-pay | Admitting: *Deleted

## 2014-03-24 DIAGNOSIS — F419 Anxiety disorder, unspecified: Secondary | ICD-10-CM | POA: Insufficient documentation

## 2014-03-24 DIAGNOSIS — M109 Gout, unspecified: Secondary | ICD-10-CM | POA: Diagnosis not present

## 2014-03-24 DIAGNOSIS — Z87828 Personal history of other (healed) physical injury and trauma: Secondary | ICD-10-CM | POA: Insufficient documentation

## 2014-03-24 DIAGNOSIS — Z8709 Personal history of other diseases of the respiratory system: Secondary | ICD-10-CM | POA: Diagnosis not present

## 2014-03-24 DIAGNOSIS — Z79899 Other long term (current) drug therapy: Secondary | ICD-10-CM | POA: Insufficient documentation

## 2014-03-24 DIAGNOSIS — Z87442 Personal history of urinary calculi: Secondary | ICD-10-CM | POA: Diagnosis not present

## 2014-03-24 DIAGNOSIS — Z720 Tobacco use: Secondary | ICD-10-CM | POA: Diagnosis not present

## 2014-03-24 DIAGNOSIS — M79672 Pain in left foot: Secondary | ICD-10-CM | POA: Diagnosis present

## 2014-03-24 NOTE — ED Notes (Signed)
Pain in her left foot x 3 weeks.

## 2014-03-25 ENCOUNTER — Emergency Department (HOSPITAL_BASED_OUTPATIENT_CLINIC_OR_DEPARTMENT_OTHER)
Admission: EM | Admit: 2014-03-25 | Discharge: 2014-03-25 | Disposition: A | Discharge status: Home / Self Care | Payer: BC Managed Care – PPO | Attending: Emergency Medicine | Admitting: Emergency Medicine

## 2014-03-25 DIAGNOSIS — M109 Gout, unspecified: Secondary | ICD-10-CM

## 2014-03-25 DIAGNOSIS — R52 Pain, unspecified: Secondary | ICD-10-CM

## 2014-03-25 LAB — URIC ACID: URIC ACID, SERUM: 5.6 mg/dL (ref 2.4–7.0)

## 2014-03-25 MED ORDER — NAPROXEN SODIUM 220 MG PO TABS: ORAL_TABLET | ORAL | Status: DC

## 2014-03-25 MED ORDER — HYDROCODONE-ACETAMINOPHEN 5-325 MG PO TABS: 1.0000 | ORAL_TABLET | Freq: Four times a day (QID) | ORAL | Status: DC | PRN

## 2014-03-25 MED ORDER — NAPROXEN 250 MG PO TABS
500.0000 mg | ORAL_TABLET | Freq: Once | ORAL | Status: AC
  Administered 2014-03-25: 500 mg via ORAL
  Filled 2014-03-25: qty 2

## 2014-03-25 MED ORDER — HYDROCODONE-ACETAMINOPHEN 5-325 MG PO TABS
1.0000 | ORAL_TABLET | Freq: Once | ORAL | Status: AC
Start: 2014-03-25 — End: 2014-03-25
  Administered 2014-03-25: 1 via ORAL
  Filled 2014-03-25: qty 1

## 2014-03-25 NOTE — ED Provider Notes (Signed)
CSN: [id number]     Arrival date & time 03/24/14  2140 History   First MD Initiated Contact with Patient 03/25/14 0028     Chief Complaint  Patient presents with  . Foot Pain     (Consider location/radiation/quality/duration/timing/severity/associated sxs/prior Treatment) HPI  This is a 38 year old female with a family history of gout. She is here with pain in her low left first MTP joint for the past 3 weeks. The pain has worsened and is now severe enough that she is having difficulty bearing weight. There is some erythema associated with the pain. She denies trauma. She denies a personal history of gout. She describes the pain as feeling like a deep cramp.she has taken Tylenol and Aleve without relief.  Past Medical History  Diagnosis Date  . Anxiety   . Back injury   . Kidney stone   . Bronchitis    Past Surgical History  Procedure Laterality Date  . Abdominal hysterectomy    . Cesarean section     No family history on file. History  Substance Use Topics  . Smoking status: Current Every Day Smoker -- 1.00 packs/day    Types: Cigarettes  . Smokeless tobacco: Never Used  . Alcohol Use: Yes     Comment: occa   OB History    No data available     Review of Systems  All other systems reviewed and are negative.   Allergies  Review of patient's allergies indicates no known allergies.  Home Medications   Prior to Admission medications   Medication Sig Start Date End Date Taking? Authorizing Provider  busPIRone (BUSPAR) 15 MG tablet Take 15 mg by mouth 3 (three) times daily.    Historical Provider, MD  dicyclomine (BENTYL) 20 MG tablet Take 1 tablet (20 mg total) by mouth 2 (two) times daily. 03/06/14   Robyn M Hess, PA-C  diphenoxylate-atropine (LOMOTIL) 2.5-0.025 MG per tablet Take 1 tablet by mouth 4 (four) times daily as needed for diarrhea or loose stools. 03/06/14   Kathrynn Speedobyn M Hess, PA-C  HYDROcodone-acetaminophen (NORCO/VICODIN) 5-325 MG per tablet Take 1 tablet by  mouth every 6 (six) hours as needed. 10/24/13   Derwood KaplanAnkit Nanavati, MD  HYDROcodone-acetaminophen (NORCO/VICODIN) 5-325 MG per tablet Take 1-2 tablets by mouth every 6 (six) hours as needed for moderate pain. 11/12/13   Geoffery Lyonsouglas Delo, MD  ibuprofen (ADVIL,MOTRIN) 600 MG tablet Take 1 tablet (600 mg total) by mouth every 6 (six) hours as needed. 10/24/13   Derwood KaplanAnkit Nanavati, MD  metroNIDAZOLE (FLAGYL) 500 MG tablet Take 1 tablet (500 mg total) by mouth 2 (two) times daily. One po bid x 7 days 03/06/14   Kathrynn Speedobyn M Hess, PA-C  mirtazapine (REMERON) 15 MG tablet Take 15 mg by mouth at bedtime.    Historical Provider, MD   BP 144/95 mmHg  Pulse 115  Temp(Src) 97.8 F (36.6 C) (Oral)  Resp 22  Wt 323 lb (146.512 kg)  SpO2 100%   Physical Exam  General: Well-developed, well-nourished female in no acute distress; appearance consistent with age of record HENT: normocephalic; atraumatic Eyes: pupils equal, round and reactive to light; extraocular muscles intact Neck: supple Heart: regular rate and rhythm Lungs: clear to auscultation bilaterally Abdomen: soft; nondistended; nontender; bowel sounds present Extremities: No deformity; full range of motion except left great toe due to pain; tenderness of the left first MTP joint with mild erythema but no warmth; pulses normal Neurologic: Awake, alert and oriented; motor function intact in all extremities and symmetric;  no facial droop Skin: Warm and dry Psychiatric: Normal mood and affect    ED Course  Procedures (including critical care time)   MDM  Nursing notes and vitals signs, including pulse oximetry, reviewed.  Summary of this visit's results, reviewed by myself:  Imaging Studies: Dg Foot Complete Left  03/24/2014   CLINICAL DATA:  Left foot pain, 3 weeks duration. Pain most severe in the region of the great toe.  EXAM: LEFT FOOT - COMPLETE 3+ VIEW  COMPARISON:  None.  FINDINGS: No evidence of fracture. No significant degenerative changes. No focal  lesions. Incidental plantar calcaneal spur.  IMPRESSION: No acute or significant bony finding. Incidental plantar calcaneal spur. No abnormality seen in the region of the great toe.   Electronically Signed   By: Paulina FusiMark  Shogry M.D.   On: 03/24/2014 22:35   Examination and history consistent with gout. Will check uric acid level which her PCP will follow up on.    Hanley SeamenJohn L Beuford Garcilazo, MD 03/25/14 [phone]

## 2015-10-14 ENCOUNTER — Emergency Department (HOSPITAL_BASED_OUTPATIENT_CLINIC_OR_DEPARTMENT_OTHER)
Admission: EM | Admit: 2015-10-14 | Discharge: 2015-10-14 | Disposition: A | Discharge status: Home / Self Care | Payer: Self-pay | Attending: Emergency Medicine | Admitting: Emergency Medicine

## 2015-10-14 ENCOUNTER — Encounter (HOSPITAL_BASED_OUTPATIENT_CLINIC_OR_DEPARTMENT_OTHER): Payer: Self-pay | Admitting: *Deleted

## 2015-10-14 ENCOUNTER — Emergency Department (HOSPITAL_BASED_OUTPATIENT_CLINIC_OR_DEPARTMENT_OTHER): Payer: Self-pay

## 2015-10-14 DIAGNOSIS — F17210 Nicotine dependence, cigarettes, uncomplicated: Secondary | ICD-10-CM | POA: Insufficient documentation

## 2015-10-14 DIAGNOSIS — J40 Bronchitis, not specified as acute or chronic: Secondary | ICD-10-CM | POA: Insufficient documentation

## 2015-10-14 DIAGNOSIS — J209 Acute bronchitis, unspecified: Secondary | ICD-10-CM

## 2015-10-14 DIAGNOSIS — J9801 Acute bronchospasm: Secondary | ICD-10-CM | POA: Insufficient documentation

## 2015-10-14 MED ORDER — ALBUTEROL SULFATE HFA 108 (90 BASE) MCG/ACT IN AERS
2.0000 | INHALATION_SPRAY | Freq: Once | RESPIRATORY_TRACT | Status: AC
  Administered 2015-10-14: 2 via RESPIRATORY_TRACT
  Filled 2015-10-14: qty 6.7

## 2015-10-14 MED ORDER — ALBUTEROL (5 MG/ML) CONTINUOUS INHALATION SOLN
10.0000 mg/h | INHALATION_SOLUTION | Freq: Once | RESPIRATORY_TRACT | Status: AC
  Administered 2015-10-14: 10 mg/h via RESPIRATORY_TRACT
  Filled 2015-10-14: qty 80

## 2015-10-14 MED ORDER — GUAIFENESIN-DM 100-10 MG/5ML PO SYRP: 5.0000 mL | ORAL_SOLUTION | ORAL | Status: AC | PRN

## 2015-10-14 MED ORDER — PREDNISONE 10 MG (21) PO TBPK: 10.0000 mg | ORAL_TABLET | Freq: Every day | ORAL | Status: AC

## 2015-10-14 MED ORDER — ALBUTEROL SULFATE (2.5 MG/3ML) 0.083% IN NEBU
INHALATION_SOLUTION | RESPIRATORY_TRACT | Status: AC
  Administered 2015-10-14: 5 mg
  Filled 2015-10-14: qty 6

## 2015-10-14 MED ORDER — IPRATROPIUM BROMIDE 0.02 % IN SOLN
RESPIRATORY_TRACT | Status: AC
  Administered 2015-10-14: 0.5 mg
  Filled 2015-10-14: qty 2.5

## 2015-10-14 MED ORDER — DEXAMETHASONE SODIUM PHOSPHATE 10 MG/ML IJ SOLN
10.0000 mg | Freq: Once | INTRAMUSCULAR | Status: AC
  Administered 2015-10-14: 10 mg via INTRAMUSCULAR
  Filled 2015-10-14: qty 1

## 2015-10-14 MED ORDER — BENZONATATE 100 MG PO CAPS: 100.0000 mg | ORAL_CAPSULE | Freq: Two times a day (BID) | ORAL | Status: AC | PRN

## 2015-10-14 MED ORDER — IPRATROPIUM BROMIDE 0.02 % IN SOLN
0.5000 mg | Freq: Once | RESPIRATORY_TRACT | Status: AC
  Administered 2015-10-14: 0.5 mg via RESPIRATORY_TRACT
  Filled 2015-10-14: qty 2.5

## 2015-10-14 NOTE — ED Notes (Signed)
Patient states she developed a non productive cough one week ago.  States she has generalized fatigue and sob with ambulation.

## 2015-10-14 NOTE — Discharge Instructions (Signed)
Read the information below.  Use the prescribed medication as directed.  Please discuss all new medications with your pharmacist.  You may return to the Emergency Department at any time for worsening condition or any new symptoms that concern you.   If you develop worsening shortness of breath, uncontrolled wheezing, severe chest pain, or fevers despite using tylenol and/or ibuprofen, return for a recheck.     °

## 2015-10-14 NOTE — ED Provider Notes (Signed)
CSN: [id number]     Arrival date & time 10/14/15  1108 History   First MD Initiated Contact with Patient 10/14/15 1144     Chief Complaint  Patient presents with  . Shortness of Breath     (Consider location/radiation/quality/duration/timing/severity/associated sxs/prior Treatment) HPI   Pt with hx smoking p/w 1 week of dry cough, SOB, wheezing.  Had sore throat initially but this resolved.  Has used nyquil and albuterol hfa without improvement.  Denies fevers, productive cough, hemoptysis, leg swelling.    Past Medical History  Diagnosis Date  . Anxiety   . Back injury   . Kidney stone   . Bronchitis    Past Surgical History  Procedure Laterality Date  . Abdominal hysterectomy    . Cesarean section     No family history on file. Social History  Substance Use Topics  . Smoking status: Current Every Day Smoker -- 1.00 packs/day    Types: Cigarettes  . Smokeless tobacco: Never Used  . Alcohol Use: No   OB History    No data available     Review of Systems  Constitutional: Negative for fever and chills.  HENT: Negative for congestion, drooling, sinus pressure and trouble swallowing.   Respiratory: Positive for cough, shortness of breath and wheezing.   Cardiovascular: Negative for chest pain and leg swelling.  Musculoskeletal: Negative for neck pain and neck stiffness.  Skin: Negative for rash.  Allergic/Immunologic: Negative for immunocompromised state.  Psychiatric/Behavioral: Negative for self-injury.      Allergies  Review of patient's allergies indicates no known allergies.  Home Medications   Prior to Admission medications   Medication Sig Start Date End Date Taking? Authorizing Provider  busPIRone (BUSPAR) 15 MG tablet Take 15 mg by mouth 3 (three) times daily.    Historical Provider, MD  HYDROcodone-acetaminophen (NORCO/VICODIN) 5-325 MG per tablet Take 1-2 tablets by mouth every 6 (six) hours as needed (for pain). 03/25/14   John Molpus, MD   mirtazapine (REMERON) 15 MG tablet Take 15 mg by mouth at bedtime.    Historical Provider, MD  naproxen sodium (ALEVE) 220 MG tablet Take 2 tablets twice daily during gout attack. Best taken with a meal. 03/25/14   John Molpus, MD   BP 156/90 mmHg  Pulse 73  Temp(Src) 98.2 F (36.8 C) (Oral)  Resp 22  Ht  (1.6 m)  Wt 124.739 kg  BMI 48.73 kg/m2  SpO2 98% Physical Exam  Constitutional: She appears well-developed and well-nourished. No distress.  HENT:  Head: Normocephalic and atraumatic.  Neck: Neck supple.  Pulmonary/Chest: Effort normal. No respiratory distress. She has decreased breath sounds. She has wheezes. She has no rhonchi. She has no rales.  Neurological: She is alert.  Skin: She is not diaphoretic.  Nursing note and vitals reviewed.   ED Course  Procedures (including critical care time) Labs Review Labs Reviewed - No data to display  Imaging Review Dg Chest 2 View  10/14/2015  CLINICAL DATA:  40 year old female with dry cough, wheezing and shortness of breath for the past week EXAM: CHEST  2 VIEW COMPARISON:  Prior chest x-ray 05/27/2014 FINDINGS: The lungs are clear and negative for focal airspace consolidation, pulmonary edema or suspicious pulmonary nodule. No pleural effusion or pneumothorax. Cardiac and mediastinal contours are within normal limits. No acute fracture or lytic or blastic osseous lesions. The visualized upper abdominal bowel gas pattern is unremarkable. IMPRESSION: No active cardiopulmonary disease. Electronically Signed   By: Isac Caddy.D.  On: 10/14/2015 12:43   I have personally reviewed and evaluated these images and lab results as part of my medical decision-making.   EKG Interpretation None      MDM   Final diagnoses:  Bronchitis with bronchospasm    Afebrile, nontoxic patient with cough, wheezing, SOB x 1 week.  Wheezing and decreased air movement on exam.  CXR negative.   Nebs, steroids with improvement.   Counseled on  smoking cessation > 5 minutes.  Pt showing improvement after hour long neb, requested discharge because babysitter had emergency and she needed to leave.  D/C home with prednisone, albuterol, tessalon, robitussin DM, PCP follow up.   Discussed result, findings, treatment, and follow up  with patient.  Pt given return precautions.  Pt verbalizes understanding and agrees with plan.         Trixie Dredgemily Wissam Resor, PA-C 10/14/15 1704  Doug SouSam Jacubowitz, MD 10/15/15 [id number]

## 2015-10-14 NOTE — ED Notes (Signed)
Pt left before discharge paperwork was given.  

## 2015-10-14 NOTE — ED Notes (Signed)
Pt reports nonproductive cough x 1 week with SOB with exertion. Pt taking nyquil and using daughters inhalers without relief.

## 2016-03-03 IMAGING — CT CT ABD-PELV W/ CM
2 of 4 series · 16 of 46 positions shown, 18 images · IV contrast (APPLIED)
Comparison: 02/28/2013

CLINICAL DATA: Low back pain for 3 days. Frequent bowel movements
with cramping. History of prior kidney stones.

EXAM:
CT ABDOMEN AND PELVIS WITH CONTRAST
TECHNIQUE: Multidetector CT imaging of the abdomen and pelvis was performed
using the standard protocol following bolus administration of
intravenous contrast.
CONTRAST:  50mL OMNIPAQUE IOHEXOL 300 MG/ML SOLN, 100mL OMNIPAQUE
IOHEXOL 300 MG/ML SOLN

[Series 2: abd/pelvis 5.0 b31f · axial · 0.87mm/px · z∈[-809,-374]mm · 13 of 97 slices shown, 15 images]
[im 5/97  soft-tissue]
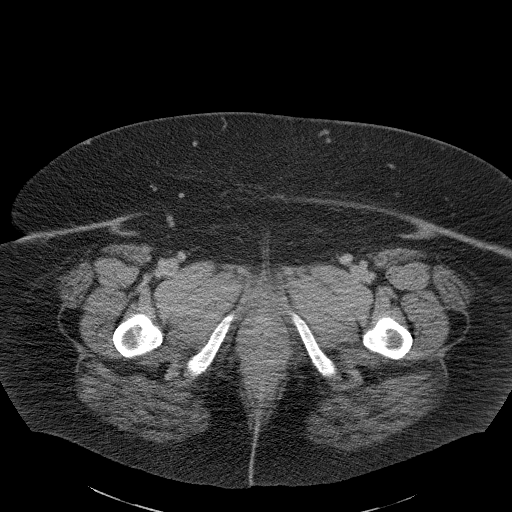
[im 5/97  bone]
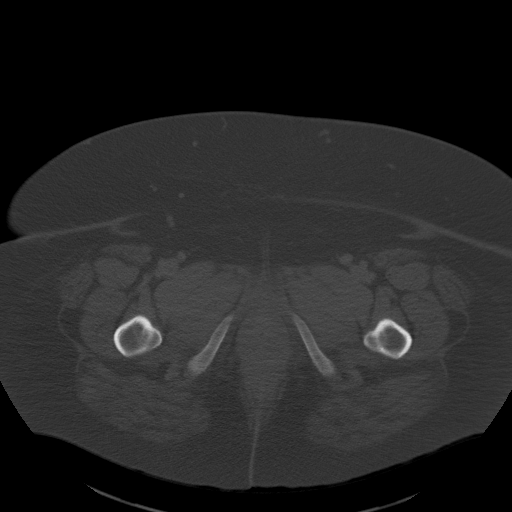
[im 13/97  soft-tissue]
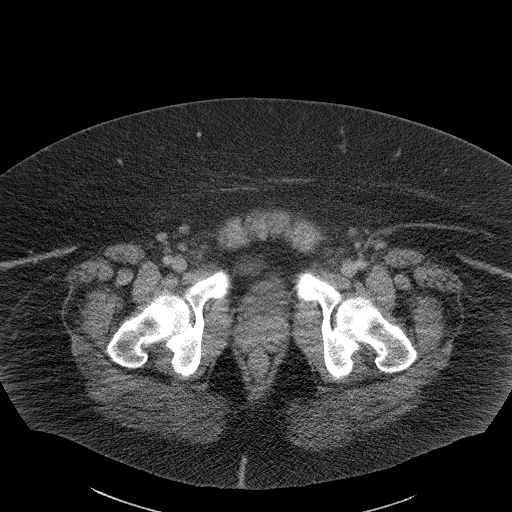
[im 21/97  soft-tissue]
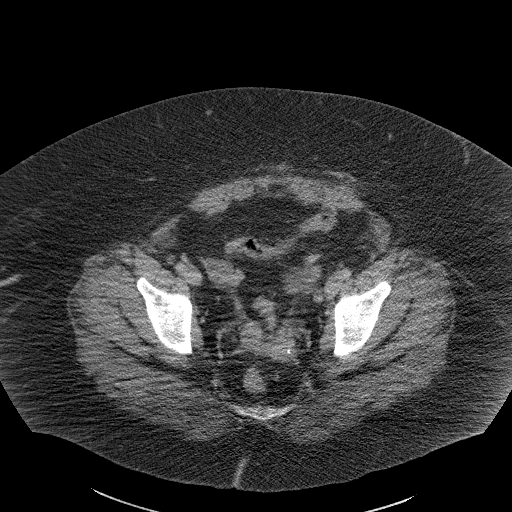
[im 26/97  soft-tissue]
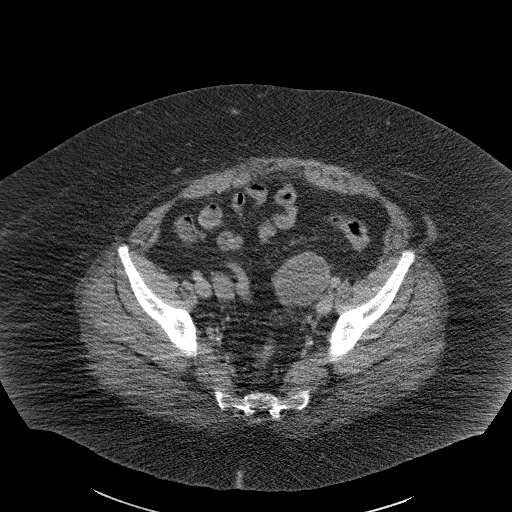
[im 34/97  soft-tissue]
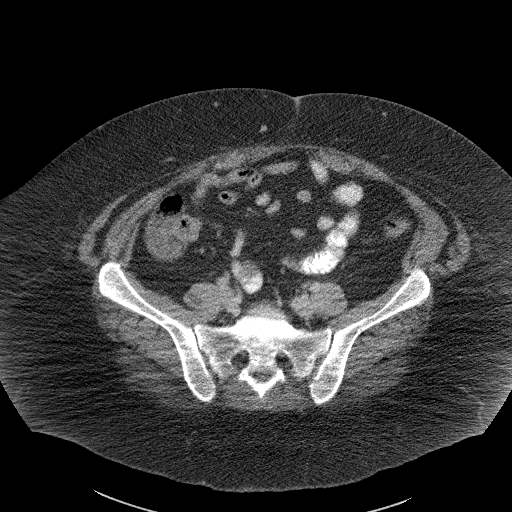
[im 42/97  soft-tissue]
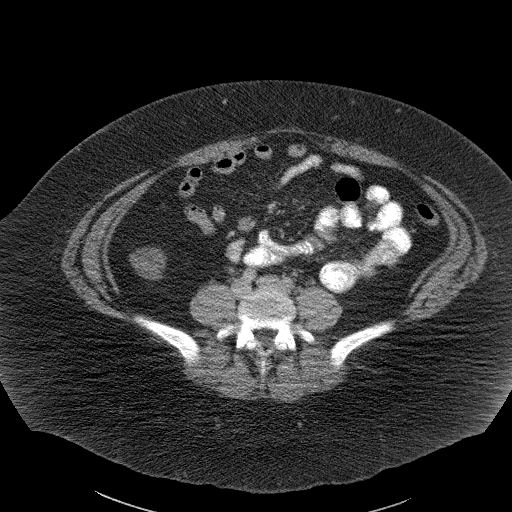
[im 51/97  soft-tissue]
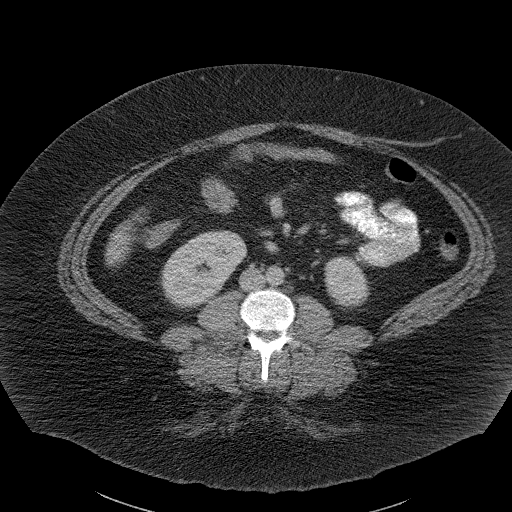
[im 55/97  soft-tissue]
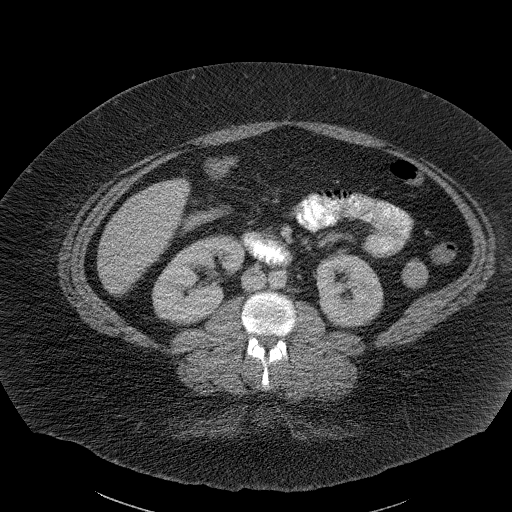
[im 63/97  soft-tissue]
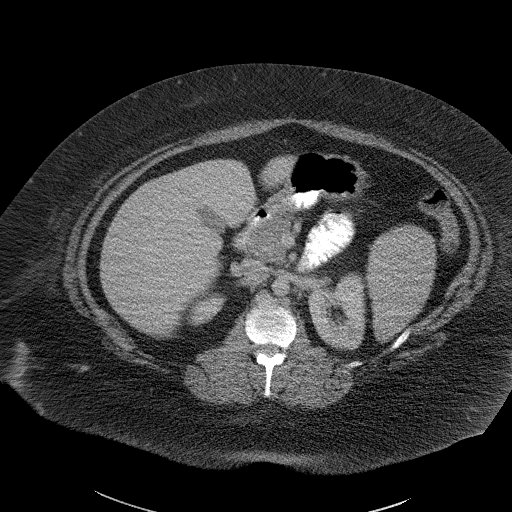
[im 63/97  bone]
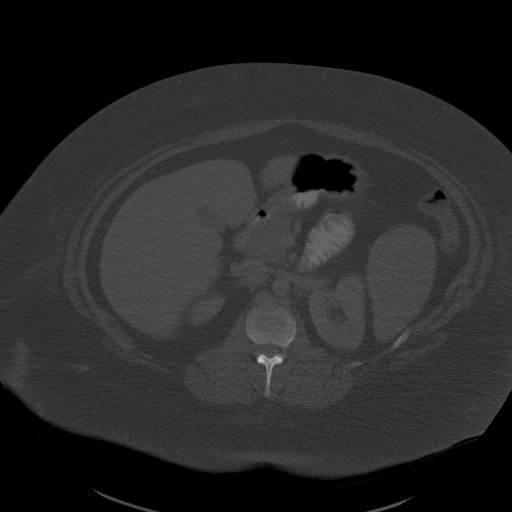
[im 71/97  soft-tissue]
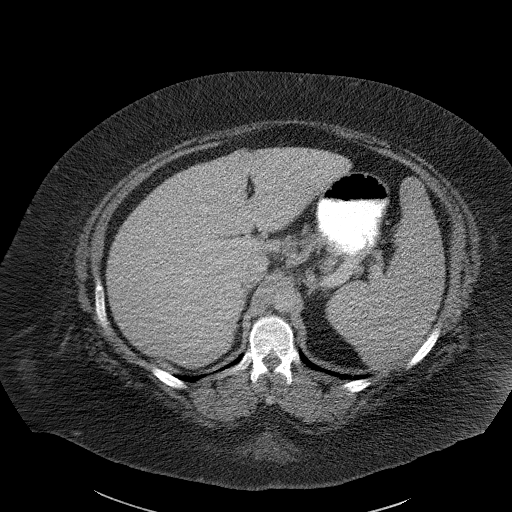
[im 76/97  soft-tissue]
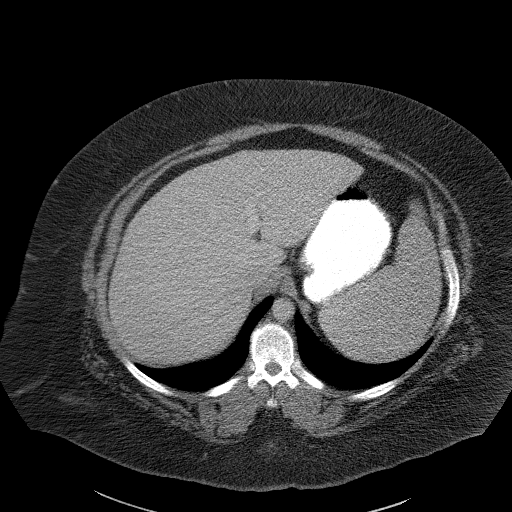
[im 84/97  soft-tissue]
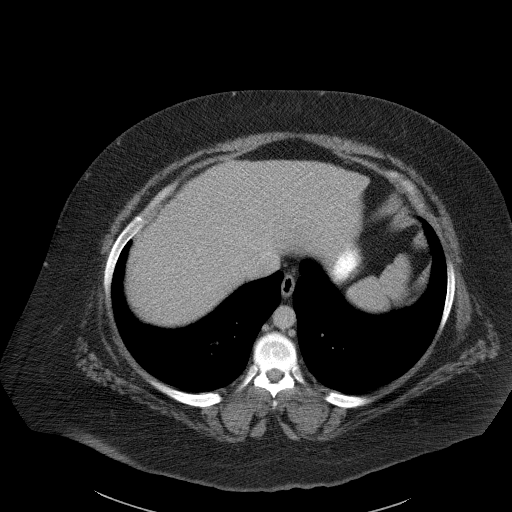
[im 92/97  soft-tissue]
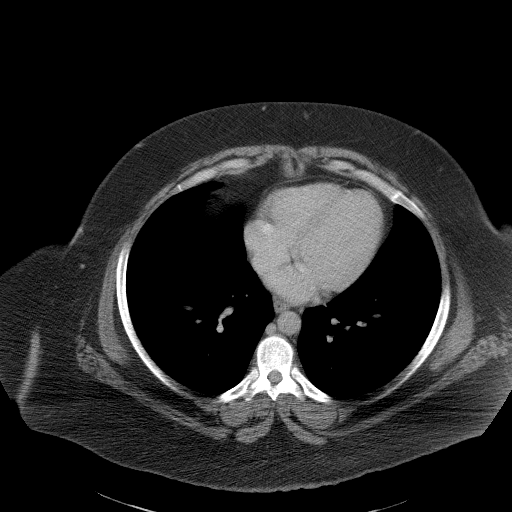

[Series 5: abd/pelvis 3.0 coronal · coronal · 0.95mm/px · 3 of 111 slices shown]
[im 37/111  soft-tissue]
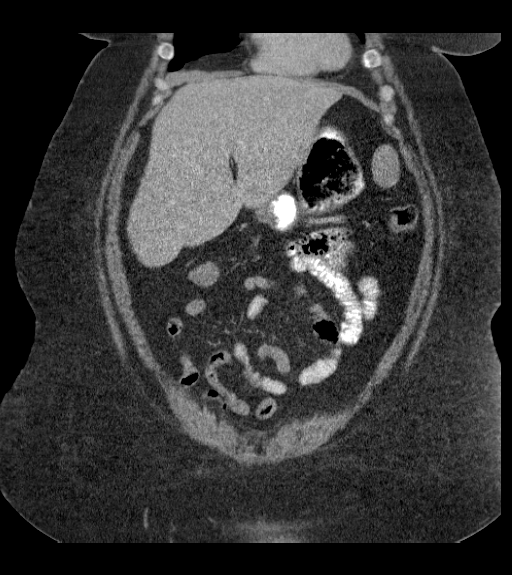
[im 49/111  soft-tissue]
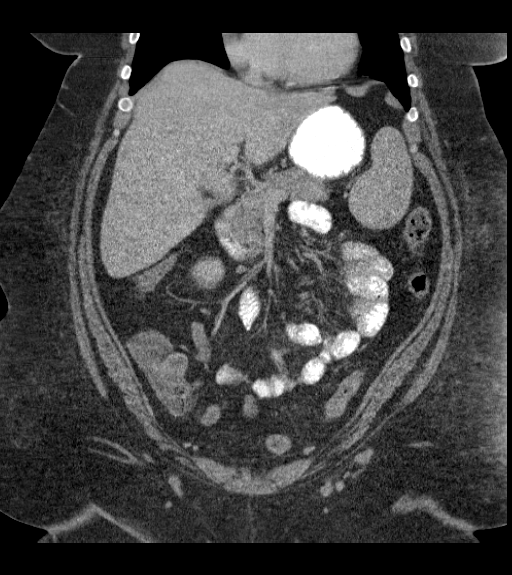
[im 62/111  soft-tissue]
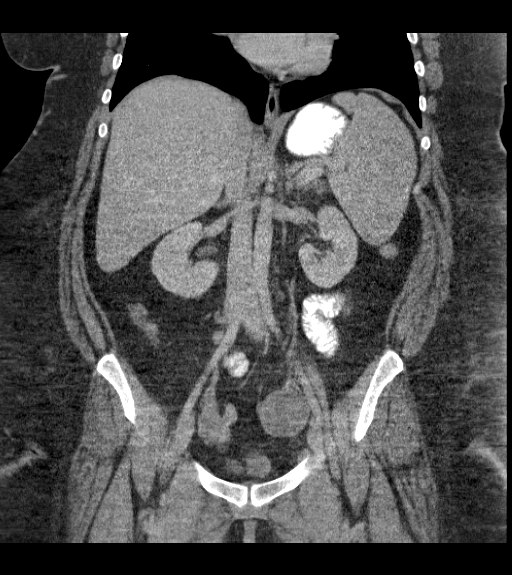

[16 of 46 positions shown; findings below may reference images not displayed]

FINDINGS: Slight fibrosis in the lung bases.

Spleen is borderline enlarged. 17 mm right adrenal gland nodule is
stable since previous study. The liver, gallbladder, pancreas, left
adrenal gland, bilateral kidneys, abdominal aorta, inferior vena
cava, and retroperitoneal lymph nodes are unremarkable. Stomach and
small bowel are normal for degree of distention. Colon is mostly
decompressed. No free air or free fluid in the abdomen. Abdominal
wall musculature appears intact.

Pelvis: Surgical absence of the uterus. Enlarged left ovary
measuring 4 x 5.4 cm. This is mildly enlarged since previous study.
Ultrasound correlation is recommended. Right ovary is unremarkable.
Appendix is normal. No free or loculated pelvic fluid collections.
No pelvic lymphadenopathy. Mild degenerative changes in the lumbar
spine. No destructive bone lesions. Bilateral spondylolysis of L5.
Possible slight spondylolisthesis at L5-S1.
IMPRESSION: Enlarging left ovary. Ultrasound correlation recommended. No
ureteral stone or obstruction. Stable right adrenal gland nodule.
Bilateral spondylolysis at L5.
